# Patient Record
Sex: Female | Born: 1950 | Race: White | Hispanic: No | Marital: Married | Smoking: Former smoker
Health system: Southern US, Community
[De-identification: ages and names within clinical notes are randomized; demographics above are authoritative.]

## PROBLEM LIST (undated history)

## (undated) DIAGNOSIS — Z8619 Personal history of other infectious and parasitic diseases: Secondary | ICD-10-CM

## (undated) DIAGNOSIS — M1990 Unspecified osteoarthritis, unspecified site: Secondary | ICD-10-CM

## (undated) DIAGNOSIS — E785 Hyperlipidemia, unspecified: Secondary | ICD-10-CM

## (undated) DIAGNOSIS — M722 Plantar fascial fibromatosis: Secondary | ICD-10-CM

## (undated) DIAGNOSIS — G8929 Other chronic pain: Secondary | ICD-10-CM

## (undated) DIAGNOSIS — I10 Essential (primary) hypertension: Secondary | ICD-10-CM

## (undated) HISTORY — PX: COLONOSCOPY WITH PROPOFOL: SHX5780

## (undated) HISTORY — DX: Personal history of other infectious and parasitic diseases: Z86.19

## (undated) HISTORY — PX: TUBAL LIGATION: SHX77

## (undated) HISTORY — DX: Hyperlipidemia, unspecified: E78.5

## (undated) HISTORY — DX: Unspecified osteoarthritis, unspecified site: M19.90

## (undated) HISTORY — DX: Essential (primary) hypertension: I10

---

## 2004-05-18 ENCOUNTER — Ambulatory Visit: Payer: Self-pay | Admitting: Internal Medicine

## 2005-06-04 ENCOUNTER — Ambulatory Visit: Payer: Self-pay | Admitting: Internal Medicine

## 2006-06-07 ENCOUNTER — Ambulatory Visit: Payer: Self-pay | Admitting: Internal Medicine

## 2007-09-28 ENCOUNTER — Ambulatory Visit: Payer: Self-pay | Admitting: Internal Medicine

## 2008-03-01 ENCOUNTER — Emergency Department: Payer: Self-pay | Admitting: Emergency Medicine

## 2008-09-30 ENCOUNTER — Ambulatory Visit: Payer: Self-pay | Admitting: Internal Medicine

## 2008-12-27 ENCOUNTER — Ambulatory Visit: Payer: Self-pay | Admitting: Unknown Physician Specialty

## 2009-11-18 ENCOUNTER — Ambulatory Visit: Payer: Self-pay

## 2010-01-05 ENCOUNTER — Ambulatory Visit: Payer: Self-pay | Admitting: Podiatry

## 2010-12-09 ENCOUNTER — Ambulatory Visit: Payer: Self-pay

## 2011-11-26 ENCOUNTER — Ambulatory Visit: Payer: Self-pay

## 2011-12-13 ENCOUNTER — Ambulatory Visit: Payer: Self-pay | Admitting: Internal Medicine

## 2012-02-09 ENCOUNTER — Ambulatory Visit: Payer: Self-pay

## 2013-02-19 ENCOUNTER — Ambulatory Visit: Payer: Self-pay

## 2013-10-04 DIAGNOSIS — E785 Hyperlipidemia, unspecified: Secondary | ICD-10-CM | POA: Insufficient documentation

## 2013-10-04 DIAGNOSIS — I10 Essential (primary) hypertension: Secondary | ICD-10-CM | POA: Insufficient documentation

## 2013-12-05 LAB — HM PAP SMEAR: HM Pap smear: NORMAL

## 2013-12-05 LAB — HM MAMMOGRAPHY: HM Mammogram: NORMAL

## 2014-02-04 LAB — HM COLONOSCOPY

## 2014-02-20 ENCOUNTER — Ambulatory Visit: Payer: Self-pay

## 2014-03-25 ENCOUNTER — Ambulatory Visit: Payer: Self-pay | Admitting: Unknown Physician Specialty

## 2014-03-26 LAB — PATHOLOGY REPORT

## 2014-05-20 ENCOUNTER — Ambulatory Visit: Payer: Self-pay | Admitting: Vascular Surgery

## 2015-02-05 ENCOUNTER — Ambulatory Visit (INDEPENDENT_AMBULATORY_CARE_PROVIDER_SITE_OTHER): Payer: 59 | Admitting: Internal Medicine

## 2015-02-05 ENCOUNTER — Encounter: Payer: Self-pay | Admitting: Internal Medicine

## 2015-02-05 ENCOUNTER — Encounter (INDEPENDENT_AMBULATORY_CARE_PROVIDER_SITE_OTHER): Payer: Self-pay

## 2015-02-05 VITALS — BP 112/62 | HR 70 | Temp 98.2°F | Ht 64.5 in | Wt 160.5 lb

## 2015-02-05 DIAGNOSIS — L900 Lichen sclerosus et atrophicus: Secondary | ICD-10-CM | POA: Diagnosis not present

## 2015-02-05 DIAGNOSIS — E785 Hyperlipidemia, unspecified: Secondary | ICD-10-CM | POA: Diagnosis not present

## 2015-02-05 DIAGNOSIS — I10 Essential (primary) hypertension: Secondary | ICD-10-CM | POA: Diagnosis not present

## 2015-02-05 DIAGNOSIS — R0789 Other chest pain: Secondary | ICD-10-CM

## 2015-02-05 MED ORDER — SIMVASTATIN 20 MG PO TABS: 20.0000 mg | ORAL_TABLET | Freq: Every day | ORAL | Status: DC

## 2015-02-05 MED ORDER — LOSARTAN POTASSIUM-HCTZ 100-25 MG PO TABS: 1.0000 | ORAL_TABLET | Freq: Every day | ORAL | Status: DC

## 2015-02-05 NOTE — Progress Notes (Signed)
Pre visit review using our clinic review tool, if applicable. No additional management support is needed unless otherwise documented below in the visit note. 

## 2015-02-05 NOTE — Progress Notes (Signed)
Subjective:    Patient ID: Mckenzie Willis, female    DOB: 1950/12/26, 64 y.o.   MRN: [id number]  HPI  64YO female presents to establish care.  Previously had some episodes of pressure in her chest. Was evaluated by Dr. Josefa Half in Cardiology and had treadmill stress test and nuclear stress test which were normal. Symptoms have improved.  Following a healthy diet. Previously weighed 176lb.  Wt Readings from Last 3 Encounters:  02/05/15 160 lb 8 oz (40.981 kg)   Lichen sclerosis history in vaginal area. Treated with Clobetasol in the past with improvement.  Past medical, surgical, family and social history per today's encounter.  Review of Systems  Constitutional: Negative for fever, chills, appetite change, fatigue and unexpected weight change.  Eyes: Negative for visual disturbance.  Respiratory: Negative for shortness of breath.   Cardiovascular: Negative for chest pain and leg swelling.  Gastrointestinal: Negative for nausea, vomiting, abdominal pain, diarrhea and constipation.  Genitourinary: Negative for vaginal bleeding, vaginal discharge, genital sores and vaginal pain.  Musculoskeletal: Negative for myalgias and arthralgias.  Skin: Negative for color change and rash.  Hematological: Negative for adenopathy. Does not bruise/bleed easily.  Psychiatric/Behavioral: Negative for suicidal ideas, sleep disturbance and dysphoric mood. The patient is not nervous/anxious.        Objective:    BP 112/62 mmHg  Pulse 70  Temp(Src) 98.2 F (36.8 C) (Oral)  Ht 5' 4.5" (1.638 m)  Wt 160 lb 8 oz (72.802 kg)  BMI 27.13 kg/m2  SpO2 96% Physical Exam  Constitutional: She is oriented to person, place, and time. She appears well-developed and well-nourished. No distress.  HENT:  Head: Normocephalic and atraumatic.  Right Ear: External ear normal.  Left Ear: External ear normal.  Nose: Nose normal.  Mouth/Throat: Oropharynx is clear and moist. No oropharyngeal exudate.    Eyes: Conjunctivae and EOM are normal. Pupils are equal, round, and reactive to light. Right eye exhibits no discharge.  Neck: Normal range of motion. Neck supple. No thyromegaly present.  Cardiovascular: Normal rate, regular rhythm, normal heart sounds and intact distal pulses.  Exam reveals no gallop and no friction rub.   No murmur heard. Pulmonary/Chest: Effort normal. No respiratory distress. She has no wheezes. She has no rales.  Abdominal: Soft. Bowel sounds are normal. She exhibits no distension and no mass. There is no tenderness. There is no rebound and no guarding.  Musculoskeletal: Normal range of motion. She exhibits no edema or tenderness.  Lymphadenopathy:    She has no cervical adenopathy.  Neurological: She is alert and oriented to person, place, and time. No cranial nerve deficit. Coordination normal.  Skin: Skin is warm and dry. No rash noted. She is not diaphoretic. No erythema. No pallor.  Psychiatric: She has a normal mood and affect. Her behavior is normal. Judgment and thought content normal.          Assessment & Plan:   Problem List Items Addressed This Visit      Unprioritized   Benign essential HTN    BP Readings from Last 3 Encounters:  02/05/15 112/62   BP well controlled. Continue Losartan-HCTZ. Will check renal function with labs.      Relevant Medications   simvastatin (ZOCOR) 20 MG tablet   losartan-hydrochlorothiazide (HYZAAR) 100-25 MG per tablet   Chest pressure    Previous workup for CAD reportedly normal including treadmill and nuclear stress test. Symptoms have resolved. Will request results on these tests from [facility].  HLD (hyperlipidemia) - Primary    Will check fasting lipids and LFTs with labs. Continue Simvastatin.      Relevant Medications   simvastatin (ZOCOR) 20 MG tablet   losartan-hydrochlorothiazide (HYZAAR) 100-25 MG per tablet   Lichen sclerosus    Previous h/o lichen sclerosis. Uses prn Clobetasol. Will  continue.          Return in about 4 weeks (around 03/05/2015) for Physical.

## 2015-02-05 NOTE — Assessment & Plan Note (Addendum)
Previous workup for CAD reportedly normal including treadmill and nuclear stress test. Symptoms have resolved. Will request results on these tests from [facility].

## 2015-02-05 NOTE — Assessment & Plan Note (Signed)
Will check fasting lipids and LFTs with labs. Continue Simvastatin.

## 2015-02-05 NOTE — Assessment & Plan Note (Signed)
Previous h/o lichen sclerosis. Uses prn Clobetasol. Will continue.

## 2015-02-05 NOTE — Assessment & Plan Note (Signed)
BP Readings from Last 3 Encounters:  02/05/15 112/62   BP well controlled. Continue Losartan-HCTZ. Will check renal function with labs.

## 2015-02-05 NOTE — Patient Instructions (Addendum)
Fasting labs and physical exam in 1 month.

## 2015-03-17 LAB — CBC AND DIFFERENTIAL
HEMATOCRIT: 39 % (ref 36–46)
Hemoglobin: 13.4 g/dL (ref 12.0–16.0)
NEUTROS ABS: 3 /uL
PLATELETS: 231 10*3/uL (ref 150–399)
WBC: 5.5 10^3/mL

## 2015-03-17 LAB — TSH: TSH: 7.13 u[IU]/mL — AB (ref 0.41–5.90)

## 2015-03-17 LAB — HEPATIC FUNCTION PANEL
ALT: 9 U/L (ref 7–35)
AST: 16 U/L (ref 13–35)
Alkaline Phosphatase: 80 U/L (ref 25–125)
Bilirubin, Total: 0.6 mg/dL

## 2015-03-17 LAB — LIPID PANEL
CHOLESTEROL: 181 mg/dL (ref 0–200)
HDL: 57 mg/dL (ref 35–70)
LDL Cholesterol: 98 mg/dL
TRIGLYCERIDES: 129 mg/dL (ref 40–160)

## 2015-03-17 LAB — BASIC METABOLIC PANEL
BUN: 17 mg/dL (ref 4–21)
Creatinine: 0.7 mg/dL (ref 0.5–1.1)
GLUCOSE: 97 mg/dL
Potassium: 4.2 mmol/L (ref 3.4–5.3)
SODIUM: 140 mmol/L (ref 137–147)

## 2015-03-17 LAB — HEMOGLOBIN A1C: Hgb A1c MFr Bld: 6 % (ref 4.0–6.0)

## 2015-03-20 ENCOUNTER — Telehealth: Payer: Self-pay | Admitting: Internal Medicine

## 2015-03-20 NOTE — Telephone Encounter (Signed)
Labs show elevated blood sugars and low thyroid function. Please set up 74min follow up visit.

## 2015-03-24 ENCOUNTER — Ambulatory Visit (INDEPENDENT_AMBULATORY_CARE_PROVIDER_SITE_OTHER): Payer: 59 | Admitting: Internal Medicine

## 2015-03-24 ENCOUNTER — Encounter: Payer: Self-pay | Admitting: Internal Medicine

## 2015-03-24 VITALS — BP 102/63 | HR 68 | Temp 98.3°F | Ht 64.5 in | Wt 160.1 lb

## 2015-03-24 DIAGNOSIS — E041 Nontoxic single thyroid nodule: Secondary | ICD-10-CM | POA: Diagnosis not present

## 2015-03-24 DIAGNOSIS — Z0000 Encounter for general adult medical examination without abnormal findings: Secondary | ICD-10-CM | POA: Diagnosis not present

## 2015-03-24 DIAGNOSIS — E039 Hypothyroidism, unspecified: Secondary | ICD-10-CM | POA: Diagnosis not present

## 2015-03-24 NOTE — Assessment & Plan Note (Signed)
Recent TSH 7. Will plan to repeat TSH and free T4 in 6-8 weeks.

## 2015-03-24 NOTE — Patient Instructions (Addendum)
Start Vit D 2000units daily Labs to recheck thyroid function in November.  Health Maintenance Adopting a healthy lifestyle and getting preventive care can go a long way to promote health and wellness. Talk with your health care provider about what schedule of regular examinations is right for you. This is a good chance for you to check in with your provider about disease prevention and staying healthy. In between checkups, there are plenty of things you can do on your own. Experts have done a lot of research about which lifestyle changes and preventive measures are most likely to keep you healthy. Ask your health care provider for more information. WEIGHT AND DIET  Eat a healthy diet  Be sure to include plenty of vegetables, fruits, low-fat dairy products, and lean protein.  Do not eat a lot of foods high in solid fats, added sugars, or salt.  Get regular exercise. This is one of the most important things you can do for your health.  Most adults should exercise for at least 150 minutes each week. The exercise should increase your heart rate and make you sweat (moderate-intensity exercise).  Most adults should also do strengthening exercises at least twice a week. This is in addition to the moderate-intensity exercise.  Maintain a healthy weight  Body mass index (BMI) is a measurement that can be used to identify possible weight problems. It estimates body fat based on height and weight. Your health care provider can help determine your BMI and help you achieve or maintain a healthy weight.  For females 39 years of age and older:   A BMI below 18.5 is considered underweight.  A BMI of 18.5 to 24.9 is normal.  A BMI of 25 to 29.9 is considered overweight.  A BMI of 30 and above is considered obese.  Watch levels of cholesterol and blood lipids  You should start having your blood tested for lipids and cholesterol at 62 years of age, then have this test every 5 years.  You may need  to have your cholesterol levels checked more often if:  Your lipid or cholesterol levels are high.  You are older than 62 years of age.  You are at high risk for heart disease.  CANCER SCREENING   Lung Cancer  Lung cancer screening is recommended for adults 84-21 years old who are at high risk for lung cancer because of a history of smoking.  A yearly low-dose CT scan of the lungs is recommended for people who:  Currently smoke.  Have quit within the past 15 years.  Have at least a 30-pack-year history of smoking. A pack year is smoking an average of one pack of cigarettes a day for 1 year.  Yearly screening should continue until it has been 15 years since you quit.  Yearly screening should stop if you develop a health problem that would prevent you from having lung cancer treatment.  Breast Cancer  Practice breast self-awareness. This means understanding how your breasts normally appear and feel.  It also means doing regular breast self-exams. Let your health care provider know about any changes, no matter how small.  If you are in your 20s or 30s, you should have a clinical breast exam (CBE) by a health care provider every 1-3 years as part of a regular health exam.  If you are 26 or older, have a CBE every year. Also consider having a breast X-ray (mammogram) every year.  If you have a family history of breast cancer,  talk to your health care provider about genetic screening.  If you are at high risk for breast cancer, talk to your health care provider about having an MRI and a mammogram every year.  Breast cancer gene (BRCA) assessment is recommended for women who have family members with BRCA-related cancers. BRCA-related cancers include:  Breast.  Ovarian.  Tubal.  Peritoneal cancers.  Results of the assessment will determine the need for genetic counseling and BRCA1 and BRCA2 testing. Cervical Cancer Routine pelvic examinations to screen for cervical cancer  are no longer recommended for nonpregnant women who are considered low risk for cancer of the pelvic organs (ovaries, uterus, and vagina) and who do not have symptoms. A pelvic examination may be necessary if you have symptoms including those associated with pelvic infections. Ask your health care provider if a screening pelvic exam is right for you.   The Pap test is the screening test for cervical cancer for women who are considered at risk.  If you had a hysterectomy for a problem that was not cancer or a condition that could lead to cancer, then you no longer need Pap tests.  If you are older than 65 years, and you have had normal Pap tests for the past 10 years, you no longer need to have Pap tests.  If you have had past treatment for cervical cancer or a condition that could lead to cancer, you need Pap tests and screening for cancer for at least 20 years after your treatment.  If you no longer get a Pap test, assess your risk factors if they change (such as having a new sexual partner). This can affect whether you should start being screened again.  Some women have medical problems that increase their chance of getting cervical cancer. If this is the case for you, your health care provider may recommend more frequent screening and Pap tests.  The human papillomavirus (HPV) test is another test that may be used for cervical cancer screening. The HPV test looks for the virus that can cause cell changes in the cervix. The cells collected during the Pap test can be tested for HPV.  The HPV test can be used to screen women 51 years of age and older. Getting tested for HPV can extend the interval between normal Pap tests from three to five years.  An HPV test also should be used to screen women of any age who have unclear Pap test results.  After 62 years of age, women should have HPV testing as often as Pap tests.  Colorectal Cancer  This type of cancer can be detected and often  prevented.  Routine colorectal cancer screening usually begins at 62 years of age and continues through 62 years of age.  Your health care provider may recommend screening at an earlier age if you have risk factors for colon cancer.  Your health care provider may also recommend using home test kits to check for hidden blood in the stool.  A small camera at the end of a tube can be used to examine your colon directly (sigmoidoscopy or colonoscopy). This is done to check for the earliest forms of colorectal cancer.  Routine screening usually begins at age 34.  Direct examination of the colon should be repeated every 5-10 years through 62 years of age. However, you may need to be screened more often if early forms of precancerous polyps or small growths are found. Skin Cancer  Check your skin from head to toe regularly.  Tell your health care provider about any new moles or changes in moles, especially if there is a change in a mole's shape or color.  Also tell your health care provider if you have a mole that is larger than the size of a pencil eraser.  Always use sunscreen. Apply sunscreen liberally and repeatedly throughout the day.  Protect yourself by wearing long sleeves, pants, a wide-brimmed hat, and sunglasses whenever you are outside. HEART DISEASE, DIABETES, AND HIGH BLOOD PRESSURE   Have your blood pressure checked at least every 1-2 years. High blood pressure causes heart disease and increases the risk of stroke.  If you are between 72 years and 44 years old, ask your health care provider if you should take aspirin to prevent strokes.  Have regular diabetes screenings. This involves taking a blood sample to check your fasting blood sugar level.  If you are at a normal weight and have a low risk for diabetes, have this test once every three years after 62 years of age.  If you are overweight and have a high risk for diabetes, consider being tested at a younger age or more  often. PREVENTING INFECTION  Hepatitis B  If you have a higher risk for hepatitis B, you should be screened for this virus. You are considered at high risk for hepatitis B if:  You were born in a country where hepatitis B is common. Ask your health care provider which countries are considered high risk.  Your parents were born in a high-risk country, and you have not been immunized against hepatitis B (hepatitis B vaccine).  You have HIV or AIDS.  You use needles to inject street drugs.  You live with someone who has hepatitis B.  You have had sex with someone who has hepatitis B.  You get hemodialysis treatment.  You take certain medicines for conditions, including cancer, organ transplantation, and autoimmune conditions. Hepatitis C  Blood testing is recommended for:  Everyone born from [birth year] through 1963.  Anyone with known risk factors for hepatitis C. Sexually transmitted infections (STIs)  You should be screened for sexually transmitted infections (STIs) including gonorrhea and chlamydia if:  You are sexually active and are younger than 62 years of age.  You are older than 62 years of age and your health care provider tells you that you are at risk for this type of infection.  Your sexual activity has changed since you were last screened and you are at an increased risk for chlamydia or gonorrhea. Ask your health care provider if you are at risk.  If you do not have HIV, but are at risk, it may be recommended that you take a prescription medicine daily to prevent HIV infection. This is called pre-exposure prophylaxis (PrEP). You are considered at risk if:  You are sexually active and do not regularly use condoms or know the HIV status of your partner(s).  You take drugs by injection.  You are sexually active with a partner who has HIV. Talk with your health care provider about whether you are at high risk of being infected with HIV. If you choose to begin PrEP, you  should first be tested for HIV. You should then be tested every 3 months for as long as you are taking PrEP.  PREGNANCY   If you are premenopausal and you may become pregnant, ask your health care provider about preconception counseling.  If you may become pregnant, take 400 to 800 micrograms (mcg) of folic acid  every day.  If you want to prevent pregnancy, talk to your health care provider about birth control (contraception). OSTEOPOROSIS AND MENOPAUSE   Osteoporosis is a disease in which the bones lose minerals and strength with aging. This can result in serious bone fractures. Your risk for osteoporosis can be identified using a bone density scan.  If you are 63 years of age or older, or if you are at risk for osteoporosis and fractures, ask your health care provider if you should be screened.  Ask your health care provider whether you should take a calcium or vitamin D supplement to lower your risk for osteoporosis.  Menopause may have certain physical symptoms and risks.  Hormone replacement therapy may reduce some of these symptoms and risks. Talk to your health care provider about whether hormone replacement therapy is right for you.  HOME CARE INSTRUCTIONS   Schedule regular health, dental, and eye exams.  Stay current with your immunizations.   Do not use any tobacco products including cigarettes, chewing tobacco, or electronic cigarettes.  If you are pregnant, do not drink alcohol.  If you are breastfeeding, limit how much and how often you drink alcohol.  Limit alcohol intake to no more than 1 drink per day for nonpregnant women. One drink equals 12 ounces of beer, 5 ounces of wine, or 1 ounces of hard liquor.  Do not use street drugs.  Do not share needles.  Ask your health care provider for help if you need support or information about quitting drugs.  Tell your health care provider if you often feel depressed.  Tell your health care provider if you have ever  been abused or do not feel safe at home. Document Released: 01/25/2011 Document Revised: 11/26/2013 Document Reviewed: 06/13/2013 [facility] Patient Information 2015 [place], [place]. This information is not intended to replace advice given to you by your health care provider. Make sure you discuss any questions you have with your health care provider.

## 2015-03-24 NOTE — Assessment & Plan Note (Signed)
General medical exam including breast exam normal today. PAP and pelvic deferred as reportedly normal 2014/2015, will request report. Mammogram ordered. Colonoscopy UTD. Labs reviewed. Flu vaccine to be obtained through her work.

## 2015-03-24 NOTE — Progress Notes (Signed)
Subjective:    Patient ID: Mckenzie Willis, female    DOB: Jun 13, 1951, 64 y.o.   MRN: [id number]  HPI  64YO female presents for annual exam.  Feeling well. No concerns today.  We reviewed recent labs which showed elevated TSH. She denies temp intolerance, constipation, fatigue, dry skin.  Past Medical History  Diagnosis Date  . Arthritis   . History of chicken pox   . Hyperlipidemia   . Hypertension    Family History  Problem Relation Age of Onset  . Hypertension Mother   . Heart disease Mother     s/p CABG in 42s  . Hypertension Brother   . Cancer Brother     Melanoma   . Hypertension Maternal Grandmother   . Diabetes Paternal Grandmother   . Cancer Paternal Aunt     Breast    Past Surgical History  Procedure Laterality Date  . Tubal ligation     Social History   Social History  . Marital Status: Married    Spouse Name: N/A  . Number of Children: N/A  . Years of Education: N/A   Social History Main Topics  . Smoking status: Former Research scientist (life sciences)  . Smokeless tobacco: None  . Alcohol Use: 0.0 oz/week    0 Standard drinks or equivalent per week     Comment: Occ glass of wine   . Drug Use: No  . Sexual Activity: Not Asked   Other Topics Concern  . None   Social History Narrative   Lives in [place] with husband and mother. Has 2 dogs in home.      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  HENT: Negative for trouble swallowing.   Eyes: Negative for visual disturbance.  Respiratory: Negative for shortness of breath.   Cardiovascular: Negative for chest pain and leg swelling.  Gastrointestinal: Negative for nausea, vomiting, abdominal pain, diarrhea and constipation.  Endocrine: Negative for cold intolerance and heat intolerance.  Musculoskeletal: Negative  for myalgias, arthralgias and neck pain.  Skin: Negative for color change and rash.  Hematological: Negative for adenopathy. Does not bruise/bleed easily.  Psychiatric/Behavioral: Negative for suicidal ideas, sleep disturbance and dysphoric mood. The patient is not nervous/anxious.        Objective:    BP 102/63 mmHg  Pulse 68  Temp(Src) 98.3 F (36.8 C) (Oral)  Ht 5' 4.5" (1.638 m)  Wt 160 lb 2 oz (72.632 kg)  BMI 27.07 kg/m2  SpO2 97% Physical Exam  Constitutional: She is oriented to person, place, and time. She appears well-developed and well-nourished. No distress.  HENT:  Head: Normocephalic and atraumatic.    Right Ear: External ear normal.  Left Ear: External ear normal.  Nose: Nose normal.  Mouth/Throat: Oropharynx is clear and moist. No oropharyngeal exudate.  Eyes: Conjunctivae are normal. Pupils are equal, round, and reactive to light. Right eye exhibits no discharge. Left eye exhibits no discharge. No scleral icterus.  Neck: Normal range of motion. Neck supple. No tracheal tenderness present. Carotid bruit is not present. No rigidity. No tracheal deviation, no edema, no erythema and normal range of motion present. Thyroid mass present. No thyromegaly present.    Cardiovascular: Normal rate, regular rhythm, normal heart sounds and intact distal pulses.  Exam reveals  no gallop and no friction rub.   No murmur heard. Pulmonary/Chest: Effort normal and breath sounds normal. No accessory muscle usage or stridor. No tachypnea. No respiratory distress. She has no decreased breath sounds. She has no wheezes. She has no rales. She exhibits no tenderness. Right breast exhibits no inverted nipple, no mass, no nipple discharge, no skin change and no tenderness. Left breast exhibits no inverted nipple, no mass, no nipple discharge, no skin change and no tenderness. Breasts are symmetrical.  Abdominal: Soft. Bowel sounds are normal. She exhibits no distension and no mass. There is no  tenderness. There is no rebound and no guarding.  Musculoskeletal: Normal range of motion. She exhibits no edema or tenderness.  Lymphadenopathy:    She has no cervical adenopathy.  Neurological: She is alert and oriented to person, place, and time. No cranial nerve deficit. She exhibits normal muscle tone. Coordination normal.  Skin: Skin is warm and dry. No rash noted. She is not diaphoretic. No erythema. No pallor.  Psychiatric: She has a normal mood and affect. Her behavior is normal. Judgment and thought content normal.          Assessment & Plan:   Problem List Items Addressed This Visit      Unprioritized   Hypothyroidism    Recent TSH 7. Will plan to repeat TSH and free T4 in 6-8 weeks.       Routine general medical examination at a health care facility - Primary    General medical exam including breast exam normal today. PAP and pelvic deferred as reportedly normal 2014/2015, will request report. Mammogram ordered. Colonoscopy UTD. Labs reviewed. Flu vaccine to be obtained through her work.      Relevant Orders   MM Digital Screening   Thyroid nodule    Thyroid nodule noted on exam. Will request report on previous US from [facility].          Return in about 3 months (around 06/24/2015) for Recheck.

## 2015-03-24 NOTE — Assessment & Plan Note (Signed)
Thyroid nodule noted on exam. Will request report on previous US from [facility].

## 2015-03-24 NOTE — Progress Notes (Signed)
Pre visit review using our clinic review tool, if applicable. No additional management support is needed unless otherwise documented below in the visit note. 

## 2015-03-27 ENCOUNTER — Encounter: Payer: Self-pay | Admitting: Internal Medicine

## 2015-04-02 ENCOUNTER — Ambulatory Visit
Admission: RE | Admit: 2015-04-02 | Discharge: 2015-04-02 | Disposition: A | Discharge status: Home / Self Care | Payer: 59 | Source: Ambulatory Visit | Attending: Internal Medicine | Admitting: Internal Medicine

## 2015-04-02 DIAGNOSIS — Z0000 Encounter for general adult medical examination without abnormal findings: Secondary | ICD-10-CM | POA: Diagnosis present

## 2015-04-02 DIAGNOSIS — Z1231 Encounter for screening mammogram for malignant neoplasm of breast: Secondary | ICD-10-CM | POA: Diagnosis present

## 2015-04-03 ENCOUNTER — Other Ambulatory Visit: Payer: Self-pay | Admitting: Internal Medicine

## 2015-04-03 DIAGNOSIS — N6489 Other specified disorders of breast: Secondary | ICD-10-CM

## 2015-04-08 ENCOUNTER — Ambulatory Visit
Admission: RE | Admit: 2015-04-08 | Discharge: 2015-04-08 | Disposition: A | Discharge status: Home / Self Care | Payer: 59 | Source: Ambulatory Visit | Attending: Internal Medicine | Admitting: Internal Medicine

## 2015-04-08 DIAGNOSIS — N6489 Other specified disorders of breast: Secondary | ICD-10-CM

## 2015-04-11 ENCOUNTER — Ambulatory Visit: Payer: 59

## 2015-04-11 ENCOUNTER — Other Ambulatory Visit: Payer: 59

## 2015-06-17 LAB — TSH: TSH: 5.29 u[IU]/mL (ref 0.41–5.90)

## 2015-06-24 ENCOUNTER — Ambulatory Visit: Payer: 59 | Admitting: Internal Medicine

## 2015-06-26 ENCOUNTER — Encounter: Payer: Self-pay | Admitting: Internal Medicine

## 2015-06-26 ENCOUNTER — Ambulatory Visit (INDEPENDENT_AMBULATORY_CARE_PROVIDER_SITE_OTHER): Payer: 59 | Admitting: Internal Medicine

## 2015-06-26 VITALS — BP 130/64 | HR 73 | Temp 98.2°F | Ht 65.0 in | Wt 168.5 lb

## 2015-06-26 DIAGNOSIS — E039 Hypothyroidism, unspecified: Secondary | ICD-10-CM

## 2015-06-26 DIAGNOSIS — I10 Essential (primary) hypertension: Secondary | ICD-10-CM

## 2015-06-26 NOTE — Patient Instructions (Signed)
Please have fasting labs done in 3 months.

## 2015-06-26 NOTE — Progress Notes (Signed)
Subjective:    Patient ID: Mckenzie Willis, female    DOB: 07-Jul-1951, 64 y.o.   MRN: [id number]  HPI 64YO female presents for follow up.  Thyroid function was low with TSH of 7 in 02/2015.  Feeling well. Had repeat labs performed. TSH 5. Free T4 normal. Notes some fatigue but attributes this to work schedule and caring for grandson. Otherwise, feeling well.  Wt Readings from Last 3 Encounters:  06/26/15 168 lb 8 oz (76.431 kg)  03/24/15 160 lb 2 oz (72.632 kg)  02/05/15 160 lb 8 oz (72.802 kg)   BP Readings from Last 3 Encounters:  06/26/15 130/64  03/24/15 102/63  02/05/15 112/62    Past Medical History  Diagnosis Date  . Arthritis   . History of chicken pox   . Hyperlipidemia   . Hypertension    Family History  Problem Relation Age of Onset  . Hypertension Mother   . Heart disease Mother     s/p CABG in 69s  . Hypertension Brother   . Cancer Brother     Melanoma   . Hypertension Maternal Grandmother   . Diabetes Paternal Grandmother   . Cancer Paternal Aunt     Breast   . Breast cancer Paternal Aunt   . Breast cancer Maternal Aunt     great aunt   Past Surgical History  Procedure Laterality Date  . Tubal ligation     Social History   Social History  . Marital Status: Married    Spouse Name: N/A  . Number of Children: N/A  . Years of Education: N/A   Social History Main Topics  . Smoking status: Former Research scientist (life sciences)  . Smokeless tobacco: None  . Alcohol Use: 0.0 oz/week    0 Standard drinks or equivalent per week     Comment: Occ glass of wine   . Drug Use: No  . Sexual Activity: Not Asked   Other Topics Concern  . None   Social History Narrative   Lives in [place] with husband and mother. Has 2 dogs in home.      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 Negative for fever, chills, appetite change and  unexpected weight change.  HENT: Negative for trouble swallowing.   Eyes: Negative for visual disturbance.  Respiratory: Negative for shortness of breath.   Cardiovascular: Negative for chest pain and leg swelling.  Gastrointestinal: Negative for abdominal pain, diarrhea and constipation.  Skin: Negative for color change and rash.  Hematological: Negative for adenopathy. Does not bruise/bleed easily.  Psychiatric/Behavioral: Negative for sleep disturbance and dysphoric mood. The patient is not nervous/anxious.        Objective:    BP 130/64 mmHg  Pulse 73  Temp(Src) 98.2 F (36.8 C) (Oral)  Ht 5\' 5"  (1.651 m)  Wt 168 lb 8 oz (76.431 kg)  BMI 28.04 kg/m2  SpO2 98% Physical Exam  Constitutional: She is oriented to person, place, and time. She appears well-developed and well-nourished. No distress.  HENT:  Head: Normocephalic and atraumatic.  Right Ear: External ear normal.  Left Ear: External ear normal.  Nose: Nose normal.  Mouth/Throat: Oropharynx is clear and moist. No oropharyngeal exudate.  Eyes: Conjunctivae are normal. Pupils are equal, round, and reactive to light. Right eye exhibits no discharge. Left eye  exhibits no discharge. No scleral icterus.  Neck: Normal range of motion. Neck supple. No tracheal deviation present. No thyromegaly present.  Cardiovascular: Normal rate, regular rhythm, normal heart sounds and intact distal pulses.  Exam reveals no gallop and no friction rub.   No murmur heard. Pulmonary/Chest: Effort normal and breath sounds normal. No respiratory distress. She has no wheezes. She has no rales. She exhibits no tenderness.  Musculoskeletal: Normal range of motion. She exhibits no edema or tenderness.  Lymphadenopathy:    She has no cervical adenopathy.  Neurological: She is alert and oriented to person, place, and time. No cranial nerve deficit. She exhibits normal muscle tone. Coordination normal.  Skin: Skin is warm and dry. No rash noted. She is  not diaphoretic. No erythema. No pallor.  Psychiatric: She has a normal mood and affect. Her behavior is normal. Judgment and thought content normal.          Assessment & Plan:   Problem List Items Addressed This Visit      Unprioritized   Benign essential HTN    BP Readings from Last 3 Encounters:  06/26/15 130/64  03/24/15 102/63  02/05/15 112/62   BP well controlled. Repeat renal function in 3 months.      Hypothyroidism - Primary    Repeat TSH was improved. Free t4 normal. Will plan to repeat labs again in 3-6 months.          Return in about 3 months (around 09/24/2015) for Recheck.

## 2015-06-26 NOTE — Assessment & Plan Note (Signed)
BP Readings from Last 3 Encounters:  06/26/15 130/64  03/24/15 102/63  02/05/15 112/62   BP well controlled. Repeat renal function in 3 months.

## 2015-06-26 NOTE — Assessment & Plan Note (Signed)
Repeat TSH was improved. Free t4 normal. Will plan to repeat labs again in 3-6 months.

## 2015-06-26 NOTE — Progress Notes (Signed)
Pre visit review using our clinic review tool, if applicable. No additional management support is needed unless otherwise documented below in the visit note. 

## 2015-12-15 ENCOUNTER — Other Ambulatory Visit: Payer: Self-pay | Admitting: Internal Medicine

## 2015-12-16 ENCOUNTER — Telehealth: Payer: Self-pay

## 2015-12-16 LAB — COMPREHENSIVE METABOLIC PANEL
A/G RATIO: 1.7 (ref 1.2–2.2)
ALBUMIN: 4.6 g/dL (ref 3.6–4.8)
ALT: 11 IU/L (ref 0–32)
AST: 18 IU/L (ref 0–40)
Alkaline Phosphatase: 74 IU/L (ref 39–117)
BILIRUBIN TOTAL: 0.5 mg/dL (ref 0.0–1.2)
BUN/Creatinine Ratio: 19 (ref 12–28)
BUN: 12 mg/dL (ref 8–27)
CO2: 25 mmol/L (ref 18–29)
Calcium: 9.3 mg/dL (ref 8.7–10.3)
Chloride: 98 mmol/L (ref 96–106)
Creatinine, Ser: 0.62 mg/dL (ref 0.57–1.00)
GFR calc Af Amer: 111 mL/min/{1.73_m2} (ref >59)
GFR calc non Af Amer: 96 mL/min/{1.73_m2} (ref >59)
Globulin, Total: 2.7 g/dL (ref 1.5–4.5)
Glucose: 90 mg/dL (ref 65–99)
POTASSIUM: 3.9 mmol/L (ref 3.5–5.2)
SODIUM: 143 mmol/L (ref 134–144)
TOTAL PROTEIN: 7.3 g/dL (ref 6.0–8.5)

## 2015-12-16 LAB — LIPID PANEL W/O CHOL/HDL RATIO
Cholesterol, Total: 230 mg/dL — ABNORMAL HIGH (ref 100–199)
HDL: 65 mg/dL (ref >39)
LDL Calculated: 143 mg/dL — ABNORMAL HIGH (ref 0–99)
TRIGLYCERIDES: 111 mg/dL (ref 0–149)
VLDL Cholesterol Cal: 22 mg/dL (ref 5–40)

## 2015-12-16 LAB — TSH: TSH: 4.16 u[IU]/mL (ref 0.450–4.500)

## 2015-12-16 LAB — T4, FREE: Free T4: 1.4 ng/dL (ref 0.82–1.77)

## 2015-12-16 NOTE — Telephone Encounter (Signed)
-----   Message from Jackolyn Confer, MD sent at 12/16/2015 10:19 AM EDT ----- Labs are remarkable for mild elevation of cholesterol.  We can review in detail at next visit. Thyroid levels are normal. Kidney and liver function are normal.

## 2015-12-16 NOTE — Telephone Encounter (Signed)
Left message for patient to call office regarding lab results and recommendations. 

## 2015-12-17 ENCOUNTER — Telehealth: Payer: Self-pay

## 2015-12-17 MED ORDER — SIMVASTATIN 20 MG PO TABS: 20.0000 mg | ORAL_TABLET | Freq: Every day | ORAL | Status: DC

## 2015-12-17 NOTE — Telephone Encounter (Signed)
Fine to restart Simvastatin 20mg  daily #90 with 3 refills.

## 2015-12-17 NOTE — Telephone Encounter (Signed)
Patient informed about lab results.  She would like to go back on simvastatin.  Please advise.

## 2015-12-17 NOTE — Telephone Encounter (Signed)
Simvastatin refilled, patient aware to start.

## 2015-12-17 NOTE — Telephone Encounter (Signed)
-----   Message from Jackolyn Confer, MD sent at 12/16/2015 10:19 AM EDT ----- Labs are remarkable for mild elevation of cholesterol.  We can review in detail at next visit. Thyroid levels are normal. Kidney and liver function are normal.

## 2016-02-03 ENCOUNTER — Other Ambulatory Visit: Payer: Self-pay | Admitting: Internal Medicine

## 2016-03-22 ENCOUNTER — Other Ambulatory Visit: Payer: Self-pay

## 2016-03-22 MED ORDER — LOSARTAN POTASSIUM-HCTZ 100-25 MG PO TABS: 1.0000 | ORAL_TABLET | Freq: Every day | ORAL | 1 refills | Status: DC

## 2016-07-14 ENCOUNTER — Other Ambulatory Visit: Payer: Self-pay | Admitting: Obstetrics and Gynecology

## 2016-07-14 DIAGNOSIS — Z1231 Encounter for screening mammogram for malignant neoplasm of breast: Secondary | ICD-10-CM

## 2016-07-26 HISTORY — PX: TOE SURGERY: SHX1073

## 2016-08-06 ENCOUNTER — Ambulatory Visit
Admission: RE | Admit: 2016-08-06 | Discharge: 2016-08-06 | Disposition: A | Discharge status: Home / Self Care | Payer: 59 | Source: Ambulatory Visit | Attending: Obstetrics and Gynecology | Admitting: Obstetrics and Gynecology

## 2016-08-06 ENCOUNTER — Other Ambulatory Visit: Payer: Self-pay | Admitting: Obstetrics and Gynecology

## 2016-08-06 DIAGNOSIS — Z1231 Encounter for screening mammogram for malignant neoplasm of breast: Secondary | ICD-10-CM

## 2016-08-17 ENCOUNTER — Telehealth: Payer: Self-pay | Admitting: Family

## 2016-08-17 NOTE — Telephone Encounter (Signed)
Pt switched to Mckenzie Willis, pt will call back to sch appt at a later time. Thank you!

## 2016-09-03 ENCOUNTER — Other Ambulatory Visit: Payer: Self-pay | Admitting: Family Medicine

## 2016-09-06 NOTE — Telephone Encounter (Signed)
Refilled 2 months; please advise that she needs appt within month. She needs to est care and have blood work

## 2016-09-06 NOTE — Telephone Encounter (Signed)
Refilled 03/22/16. Pt last seen 06/26/15. Pt called to switch to [facility] on 08/17/16 with no appt made. Please advise/

## 2016-10-04 ENCOUNTER — Other Ambulatory Visit: Payer: Self-pay | Admitting: Family

## 2017-10-03 ENCOUNTER — Other Ambulatory Visit: Payer: Self-pay | Admitting: Nurse Practitioner

## 2017-10-03 DIAGNOSIS — Z1231 Encounter for screening mammogram for malignant neoplasm of breast: Secondary | ICD-10-CM

## 2017-10-12 ENCOUNTER — Ambulatory Visit
Admission: RE | Admit: 2017-10-12 | Discharge: 2017-10-12 | Disposition: A | Discharge status: Home / Self Care | Payer: Managed Care, Other (non HMO) | Source: Ambulatory Visit | Attending: Nurse Practitioner | Admitting: Nurse Practitioner

## 2017-10-12 DIAGNOSIS — Z1231 Encounter for screening mammogram for malignant neoplasm of breast: Secondary | ICD-10-CM | POA: Diagnosis present

## 2018-02-13 ENCOUNTER — Ambulatory Visit (INDEPENDENT_AMBULATORY_CARE_PROVIDER_SITE_OTHER): Payer: Managed Care, Other (non HMO)

## 2018-02-13 ENCOUNTER — Ambulatory Visit: Payer: Managed Care, Other (non HMO) | Admitting: Podiatry

## 2018-02-13 ENCOUNTER — Encounter: Payer: Self-pay | Admitting: Podiatry

## 2018-02-13 VITALS — BP 143/72 | HR 70 | Resp 16

## 2018-02-13 DIAGNOSIS — M898X7 Other specified disorders of bone, ankle and foot: Secondary | ICD-10-CM

## 2018-02-13 NOTE — Progress Notes (Signed)
  Subjective:  Patient ID: Mckenzie Willis, female    DOB: Jan 26, 1951,  MRN: [id number] HPI Chief Complaint  Patient presents with  . 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 New Patient (Initial Visit)    67 y.o. female presents with the above complaint.   ROS: Denies fever chills nausea vomiting muscle aches pains calf pain back pain chest pain shortness of breath.  Past Medical History:  Diagnosis Date  . Arthritis   . History of chicken pox   . Hyperlipidemia   . Hypertension    Past Surgical History:  Procedure Laterality Date  . TUBAL LIGATION      Current Outpatient Medications:  .  losartan-hydrochlorothiazide (HYZAAR) 100-25 MG tablet, TAKE 1 TABLET BY MOUTH  DAILY, Disp: 60 tablet, Rfl: 3  No Known Allergies Review of Systems Objective:   Vitals:   02/13/18 1502  BP: (!) 143/72  Pulse: 70  Resp: 16    General: Well developed, nourished, in no acute distress, alert and oriented x3   Dermatological: Skin is warm, dry and supple bilateral. Nails x 10 are well maintained; remaining integument appears unremarkable at this time. There are no open sores, no preulcerative lesions, no rash or signs of infection present.  Vascular: Dorsalis Pedis artery and Posterior Tibial artery pedal pulses are 2/4 bilateral with immedate capillary fill time. Pedal hair growth present. No varicosities and no lower extremity edema present bilateral.   Neruologic: Grossly intact via light touch bilateral. Vibratory intact via tuning fork bilateral. Protective threshold with Semmes Wienstein monofilament intact to all pedal sites bilateral. Patellar and Achilles deep tendon reflexes 2+ bilateral. No Babinski or clonus noted bilateral.   Musculoskeletal: No gross boney pedal deformities bilateral. No pain, crepitus, or  limitation noted with foot and ankle range of motion bilateral. Muscular strength 5/5 in all groups tested bilateral.  Rotated hammertoe deformity with pain on the lateral aspect of the nailbed and plate.  Gait: Unassisted, Nonantalgic.    Radiographs:  Radiographs taken today do not demonstrate any major abnormality other than some osteoarthritic changes to the fifth digit left foot.  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  Consented her today for a rotational arthroplasty fifth digit left as well as an exostectomy fifth digit left with excision of lateral border of the nail.     Azjah Pardo T. [place], [place]

## 2018-02-13 NOTE — Patient Instructions (Signed)
Pre-Operative Instructions  Congratulations, you have decided to take an important step towards improving your quality of life.  You can be assured that the doctors and staff at [facility] will be with you every step of the way.  Here are some important things you should know:  1. Plan to be at the surgery center/hospital at least 1 (one) hour prior to your scheduled time, unless otherwise directed by the surgical center/hospital staff.  You must have a responsible adult accompany you, remain during the surgery and drive you home.  Make sure you have directions to the surgical center/hospital to ensure you arrive on time. 2. If you are having surgery at [facility] or [facility] hospitals, you will need a copy of your medical history and physical form from your family physician within one month prior to the date of surgery. We will give you a form for your primary physician to complete.  3. We make every effort to accommodate the date you request for surgery.  However, there are times where surgery dates or times have to be moved.  We will contact you as soon as possible if a change in schedule is required.   4. No aspirin/ibuprofen for one week before surgery.  If you are on aspirin, any non-steroidal anti-inflammatory medications (Mobic, Aleve, Ibuprofen) should not be taken seven (7) days prior to your surgery.  You make take Tylenol for pain prior to surgery.  5. Medications - If you are taking daily heart and blood pressure medications, seizure, reflux, allergy, asthma, anxiety, pain or diabetes medications, make sure you notify the surgery center/hospital before the day of surgery so they can tell you which medications you should take or avoid the day of surgery. 6. No food or drink after midnight the night before surgery unless directed otherwise by surgical center/hospital staff. 7. No alcoholic beverages 24-hours prior to surgery.  No smoking 24-hours prior or 24-hours after  surgery. 8. Wear loose pants or shorts. They should be loose enough to fit over bandages, boots, and casts. 9. Don't wear slip-on shoes. Sneakers are preferred. 10. Bring your boot with you to the surgery center/hospital.  Also bring crutches or a walker if your physician has prescribed it for you.  If you do not have this equipment, it will be provided for you after surgery. 11. If you have not been contacted by the surgery center/hospital by the day before your surgery, call to confirm the date and time of your surgery. 12. Leave-time from work may vary depending on the type of surgery you have.  Appropriate arrangements should be made prior to surgery with your employer. 13. Prescriptions will be provided immediately following surgery by your doctor.  Fill these as soon as possible after surgery and take the medication as directed. Pain medications will not be refilled on weekends and must be approved by the doctor. 14. Remove nail polish on the operative foot and avoid getting pedicures prior to surgery. 15. Wash the night before surgery.  The night before surgery wash the foot and leg well with water and the antibacterial soap provided. Be sure to pay special attention to beneath the toenails and in between the toes.  Wash for at least three (3) minutes. Rinse thoroughly with water and dry well with a towel.  Perform this wash unless told not to do so by your physician.  Enclosed: 1 Ice pack (please put in freezer the night before surgery)   1 Hibiclens skin cleaner     Pre-op instructions  If you have any questions regarding the instructions, please do not hesitate to call our office.  [place]: [address], [place], [place] [address] -- [phone]  [place]: [address] [place], [place] [address] -- [phone]  [place]: [address]  , [place] [address] -- [phone]  [place]: [address], [address], [place], [place] [address] -- [phone]  Website:  [email or web address] 

## 2018-02-16 ENCOUNTER — Telehealth: Payer: Self-pay | Admitting: *Deleted

## 2018-02-16 NOTE — Telephone Encounter (Signed)
"  I'm calling to schedule my appointment for surgery.  I'm a patient of Dr. Milinda Pointer.  He told me to give you a call."  Do you have a date in mind that you would like to schedule it?  "Does he have anything on August 30?"  He does not have anything available on that date.  "Does he have anything before that date?"  He can do it on August 23 but August 16 would be better.  "Let's schedule it for August 16 then.  What time will I need to be there?"  Someone from the surgical center will call you a day or two prior to your surgery date.  They will give you your arrival time.  "Can I register now?"  Yes, you can register now since you have your surgery date.  "I forgot to ask you something.  Do I need to call and get my surgery authorized or is that something that you all take care of?"  I will call to get the authorization if it's needed.

## 2018-03-08 ENCOUNTER — Other Ambulatory Visit: Payer: Self-pay | Admitting: Podiatry

## 2018-03-08 MED ORDER — OXYCODONE-ACETAMINOPHEN 10-325 MG PO TABS: 1.0000 | ORAL_TABLET | Freq: Four times a day (QID) | ORAL | 0 refills | Status: AC | PRN

## 2018-03-08 MED ORDER — ONDANSETRON HCL 4 MG PO TABS: 4.0000 mg | ORAL_TABLET | Freq: Three times a day (TID) | ORAL | 0 refills | Status: DC | PRN

## 2018-03-08 MED ORDER — CEPHALEXIN 500 MG PO CAPS: 500.0000 mg | ORAL_CAPSULE | Freq: Three times a day (TID) | ORAL | 0 refills | Status: DC

## 2018-03-10 ENCOUNTER — Encounter: Payer: Self-pay | Admitting: Podiatry

## 2018-03-10 DIAGNOSIS — M25775 Osteophyte, left foot: Secondary | ICD-10-CM

## 2018-03-10 DIAGNOSIS — M2042 Other hammer toe(s) (acquired), left foot: Secondary | ICD-10-CM | POA: Diagnosis not present

## 2018-03-13 ENCOUNTER — Telehealth: Payer: Self-pay | Admitting: *Deleted

## 2018-03-13 NOTE — Telephone Encounter (Signed)
POST OP CALL-    1) General condition stated by the patient: GOOD  2) Is the pt having pain? SOME  3) Pain score:   4) Has the pt taken Rx'd pain medication, regularly or PRN? PRN  5) Is the pain medication giving relief? YES  6) Any fever, chills, nausea, or vomiting, shortness of breath or tightness in calf? PAIN MEDS MADE NAUSEATED  7) Is the bandage clean, dry and intact? YES  8) Is there excessive tightness, bleeding or drainage coming through the bandage? NO  9) Did you understand all of the post op instruction sheet given? YES  10) Any questions or concerns regarding post op care/recovery? QUESTIONS REGARDING WHEN STITCHES WOULD BE REMOVED TOLD HER THEY WOULD BE EVALUATED AT 2 WEEKS AND REMOVED IF INCISION LOOKED GOOD    Confirmed POV appointment with patient

## 2018-03-15 ENCOUNTER — Ambulatory Visit (INDEPENDENT_AMBULATORY_CARE_PROVIDER_SITE_OTHER): Payer: Managed Care, Other (non HMO)

## 2018-03-15 ENCOUNTER — Ambulatory Visit (INDEPENDENT_AMBULATORY_CARE_PROVIDER_SITE_OTHER): Payer: Managed Care, Other (non HMO) | Admitting: Podiatry

## 2018-03-15 ENCOUNTER — Encounter: Payer: Self-pay | Admitting: Podiatry

## 2018-03-15 VITALS — BP 137/79 | HR 69 | Temp 97.8°F

## 2018-03-15 DIAGNOSIS — M898X7 Other specified disorders of bone, ankle and foot: Secondary | ICD-10-CM

## 2018-03-15 NOTE — Progress Notes (Signed)
She presents today for her first postop visit date of surgery March 10, 2018 the rotational hammertoe with exostectomy lateral aspect of the fifth digit.  States that everything is doing well and she is not having much pain she states that she did not even have to take a pain pill.  She denies fever chills nausea vomiting muscle aches and pains.  Objective: Presents with her Darco shoe today dressed sterile dressing intact was removed demonstrates no erythema edema cellulitis drainage or odor toe is rectus in good position sutures are intact margins are well coapted there is no signs of infection.  Radiographs confirm arthroplasty and exostectomy fifth digit left.  Assessment: Well-healing surgical foot.  Plan: Redressed today dressed a compressive dressing follow-up with her in 1 week for suture removal.

## 2018-03-22 ENCOUNTER — Ambulatory Visit (INDEPENDENT_AMBULATORY_CARE_PROVIDER_SITE_OTHER): Payer: Managed Care, Other (non HMO) | Admitting: Podiatry

## 2018-03-22 ENCOUNTER — Encounter: Payer: Self-pay | Admitting: Podiatry

## 2018-03-22 DIAGNOSIS — M898X7 Other specified disorders of bone, ankle and foot: Secondary | ICD-10-CM

## 2018-03-22 NOTE — Progress Notes (Signed)
She presents today for follow-up of her derotational hammertoe repair with exostectomy fifth left.  Date of surgery 03/10/2018.  She states that seems to be getting better all the time.  Objective: Vital signs are stable she is alert and oriented x3.  Pulses are palpable.  Dry sterile dressing intact was removed demonstrates no erythema to some mild edema no cellulitis drainage or odor sutures appear to be intact margins for the most part appear to be well coapted I was able to remove some of the sutures today but decided to relieve some of the sutures and to maintain correction.  Assessment: Well-healing surgical foot.  Plan: We will follow-up with her in 1 week just to remove the remaining sutures and I placed her in a light Coban dressing.

## 2018-03-29 ENCOUNTER — Ambulatory Visit (INDEPENDENT_AMBULATORY_CARE_PROVIDER_SITE_OTHER): Payer: Managed Care, Other (non HMO) | Admitting: Podiatry

## 2018-03-29 ENCOUNTER — Encounter: Payer: Self-pay | Admitting: Podiatry

## 2018-03-29 DIAGNOSIS — M898X7 Other specified disorders of bone, ankle and foot: Secondary | ICD-10-CM

## 2018-03-29 DIAGNOSIS — Z9889 Other specified postprocedural states: Secondary | ICD-10-CM

## 2018-03-29 NOTE — Progress Notes (Signed)
She presents today for her postop visit date of surgery March 10, 2018 derotational arthroplasty fifth digit left foot with a digital exostectomy fifth left.  She states that is a little bit sensitive on the side but seems to be doing well.  Objective: Vital signs are stable alert and oriented x3.  There is no erythema edema cellulitis drainage or odor.  Margins are well coapted sutures are intact.  Assessment: Well-healing surgical foot.  Plan: Demonstrated digital compression dressing which I provided her with Covan.  She will continue to do so I will allow her to get this wet and I will follow-up with her in about 3 weeks to make sure she is doing well.  Were also going to get her a toe cover for her Darco shoe and we will also write her a note to let her go back to work.

## 2018-04-26 ENCOUNTER — Encounter: Payer: Managed Care, Other (non HMO) | Admitting: Podiatry

## 2018-05-22 ENCOUNTER — Encounter

## 2018-05-22 ENCOUNTER — Encounter: Payer: Self-pay | Admitting: Podiatry

## 2018-05-22 ENCOUNTER — Ambulatory Visit: Payer: Managed Care, Other (non HMO)

## 2018-05-22 ENCOUNTER — Ambulatory Visit (INDEPENDENT_AMBULATORY_CARE_PROVIDER_SITE_OTHER): Payer: Managed Care, Other (non HMO) | Admitting: Podiatry

## 2018-05-22 DIAGNOSIS — Z9889 Other specified postprocedural states: Secondary | ICD-10-CM

## 2018-05-22 DIAGNOSIS — M898X7 Other specified disorders of bone, ankle and foot: Secondary | ICD-10-CM

## 2018-05-23 NOTE — Progress Notes (Signed)
She presents today for follow-up of derotational arthroplasty fifth digit left foot exostectomy fifth toe she states is finally getting better date of surgery 03/10/2018.  Tolerated procedure well but states that she still has some swelling.  Objective: Vital signs are stable alert and oriented x3.  Toe appears to be healing very nicely there is mild edema no erythema sialitis drainage odor mild tenderness on the lateral most aspect of the well-healed incision this is more than likely consistent with some scar tissue irritating the digital nerve.  Assessment: Well-healing surgical toe fifth left.  Plan: Follow-up with me on an as-needed basis continue massage therapy and compression therapy as needed.

## 2018-09-04 ENCOUNTER — Other Ambulatory Visit: Payer: Self-pay | Admitting: Nurse Practitioner

## 2018-09-04 DIAGNOSIS — Z1231 Encounter for screening mammogram for malignant neoplasm of breast: Secondary | ICD-10-CM

## 2018-10-16 LAB — CBC AND DIFFERENTIAL
HCT: 38 (ref 36–46)
Hemoglobin: 12.9 (ref 12.0–16.0)
Platelets: 312 (ref 150–399)
WBC: 4.4

## 2018-10-16 LAB — HEPATIC FUNCTION PANEL
ALT: 10 (ref 7–35)
AST: 20 (ref 13–35)
Alkaline Phosphatase: 76 (ref 25–125)

## 2018-10-16 LAB — BASIC METABOLIC PANEL
BUN: 16 (ref 4–21)
CO2: 25 — AB (ref 13–22)
Chloride: 97 — AB (ref 99–108)
Creatinine: 0.7 (ref 0.5–1.1)
Glucose: 84
Potassium: 3.9 (ref 3.4–5.3)
Sodium: 137 (ref 137–147)

## 2018-10-16 LAB — COMPREHENSIVE METABOLIC PANEL
Albumin: 4.5 (ref 3.5–5.0)
Calcium: 9.2 (ref 8.7–10.7)
GFR calc Af Amer: 104
GFR calc non Af Amer: 91
Globulin: 2.2

## 2018-10-16 LAB — LIPID PANEL
Cholesterol: 229 — AB (ref 0–200)
HDL: 71 — AB (ref 35–70)
LDL Cholesterol: 142
Triglycerides: 81 (ref 40–160)

## 2018-10-16 LAB — TSH: TSH: 3.73 (ref 0.41–5.90)

## 2018-10-16 LAB — VITAMIN D 25 HYDROXY (VIT D DEFICIENCY, FRACTURES): Vit D, 25-Hydroxy: 21.3

## 2018-10-16 LAB — CBC: RBC: 4.32 (ref 3.87–5.11)

## 2018-10-23 LAB — HM PAP SMEAR: HM Pap smear: NEGATIVE

## 2018-11-06 ENCOUNTER — Encounter: Payer: Self-pay | Admitting: Family Medicine

## 2018-12-25 ENCOUNTER — Ambulatory Visit
Admission: RE | Admit: 2018-12-25 | Discharge: 2018-12-25 | Disposition: A | Discharge status: Home / Self Care | Payer: Managed Care, Other (non HMO) | Source: Ambulatory Visit | Attending: Nurse Practitioner | Admitting: Nurse Practitioner

## 2018-12-25 ENCOUNTER — Other Ambulatory Visit: Payer: Self-pay

## 2018-12-25 DIAGNOSIS — Z1231 Encounter for screening mammogram for malignant neoplasm of breast: Secondary | ICD-10-CM | POA: Insufficient documentation

## 2019-02-15 LAB — BASIC METABOLIC PANEL
BUN: 18 (ref 4–21)
CO2: 24 — AB (ref 13–22)
Chloride: 98 — AB (ref 99–108)
Creatinine: 0.6 (ref 0.5–1.1)
Glucose: 83
Potassium: 3.8 (ref 3.4–5.3)
Sodium: 138 (ref 137–147)

## 2019-02-15 LAB — LIPID PANEL
Cholesterol: 173 (ref 0–200)
HDL: 72 — AB (ref 35–70)
LDL Cholesterol: 87
Triglycerides: 69 (ref 40–160)

## 2019-02-15 LAB — COMPREHENSIVE METABOLIC PANEL
Albumin: 4.7 (ref 3.5–5.0)
Calcium: 9.8 (ref 8.7–10.7)
GFR calc Af Amer: 108
GFR calc non Af Amer: 94
Globulin: 2.4

## 2019-02-15 LAB — HEPATIC FUNCTION PANEL
ALT: 9 (ref 7–35)
AST: 17 (ref 13–35)
Alkaline Phosphatase: 67 (ref 25–125)

## 2019-02-15 LAB — VITAMIN D 25 HYDROXY (VIT D DEFICIENCY, FRACTURES): Vit D, 25-Hydroxy: 51.6

## 2019-02-15 LAB — VITAMIN B12: Vitamin B-12: 1233

## 2019-02-15 LAB — TSH: TSH: 3.6 (ref 0.41–5.90)

## 2019-06-11 DIAGNOSIS — D485 Neoplasm of uncertain behavior of skin: Secondary | ICD-10-CM | POA: Diagnosis not present

## 2019-06-11 DIAGNOSIS — C4401 Basal cell carcinoma of skin of lip: Secondary | ICD-10-CM | POA: Diagnosis not present

## 2019-07-18 DIAGNOSIS — C4401 Basal cell carcinoma of skin of lip: Secondary | ICD-10-CM | POA: Diagnosis not present

## 2019-07-25 DIAGNOSIS — Z48817 Encounter for surgical aftercare following surgery on the skin and subcutaneous tissue: Secondary | ICD-10-CM | POA: Diagnosis not present

## 2019-08-09 ENCOUNTER — Encounter: Payer: Self-pay | Admitting: Family Medicine

## 2019-08-09 ENCOUNTER — Ambulatory Visit (INDEPENDENT_AMBULATORY_CARE_PROVIDER_SITE_OTHER): Payer: PPO | Admitting: Family Medicine

## 2019-08-09 ENCOUNTER — Other Ambulatory Visit: Payer: Self-pay

## 2019-08-09 VITALS — BP 128/68 | HR 76 | Temp 97.1°F | Resp 16 | Ht 65.0 in | Wt 160.0 lb

## 2019-08-09 DIAGNOSIS — G8929 Other chronic pain: Secondary | ICD-10-CM | POA: Diagnosis not present

## 2019-08-09 DIAGNOSIS — R1032 Left lower quadrant pain: Secondary | ICD-10-CM

## 2019-08-09 DIAGNOSIS — E782 Mixed hyperlipidemia: Secondary | ICD-10-CM | POA: Diagnosis not present

## 2019-08-09 DIAGNOSIS — M25561 Pain in right knee: Secondary | ICD-10-CM | POA: Diagnosis not present

## 2019-08-09 DIAGNOSIS — Z1159 Encounter for screening for other viral diseases: Secondary | ICD-10-CM

## 2019-08-09 DIAGNOSIS — I10 Essential (primary) hypertension: Secondary | ICD-10-CM | POA: Diagnosis not present

## 2019-08-09 DIAGNOSIS — M533 Sacrococcygeal disorders, not elsewhere classified: Secondary | ICD-10-CM | POA: Diagnosis not present

## 2019-08-09 DIAGNOSIS — E663 Overweight: Secondary | ICD-10-CM

## 2019-08-09 DIAGNOSIS — Z803 Family history of malignant neoplasm of breast: Secondary | ICD-10-CM | POA: Diagnosis not present

## 2019-08-09 DIAGNOSIS — Z83719 Family history of colon polyps, unspecified: Secondary | ICD-10-CM

## 2019-08-09 DIAGNOSIS — Z8371 Family history of colonic polyps: Secondary | ICD-10-CM | POA: Diagnosis not present

## 2019-08-09 NOTE — Progress Notes (Signed)
Patient: Mckenzie Willis Female    DOB: 02-Nov-1950   69 y.o.   MRN: [id number] Visit Date: 08/09/2019  Today's Provider: Lavon Paganini, MD   Chief Complaint  Patient presents with  . New Patient (Initial Visit)  . Establish Care   Subjective:     HPI New Patient Establishing Care: Patient is here today to establish Care. Former PCP: [facility]. Reason for leaving: wanting a new facility.  3-4 weeks ago, had irregular feeling that wasn't truly pain in LLQ.  It comes and goes, but is overall better.  Occurred last night.  None at all today.  Describes it as "almost liek a small pain" and dull  Has not tried anything for it.  Denies N/V/C/D. No aggravating or alleviating factors that she knows of.  HTN: - Medications: losartan-HCTZ 100-25mg  daily - Compliance: good - Checking BP at home: yes, typically 130s-140s/70 - Denies any SOB, CP, vision changes, LE edema, medication SEs, or symptoms of hypotension - Diet: Thinks that she could do better at decreasing sodium in her diet - Exercise: None regularly, but tries to stay active  HLD - medications: rosuvastatin 10 mg  - compliance: good, but only in the last month - medication SEs: None  Reports OA in R knee and low back.  Walking for exercise, but notices back pain when walking up an incline.  Was recommended to see PT previously, but has not yet.  Uses voltaren gel prn.  Back pain is over R SI joint/.  Worse with activity.  Has not tried any medications for this or other treatments.  A little better with bending forward sometimes.  Family history of breast cancer.  Getting regularly mammograms.  No history of abnormal mammogram  Mother had colon polyps, so she has colonoscopies q21yr.  Last in 2015.  Missed 2020 one due to [place] pandemic.   No Known Allergies   Current Outpatient Medications:  .  Cholecalciferol (VITAMIN D-3) 25 MCG (1000 UT) CAPS, Take 1 capsule by mouth daily., Disp: , Rfl:  .   diclofenac Sodium (VOLTAREN) 1 % GEL, Apply topically 4 (four) times daily., Disp: , Rfl:  .  losartan-hydrochlorothiazide (HYZAAR) 100-25 MG tablet, TAKE 1 TABLET BY MOUTH  DAILY, Disp: 60 tablet, Rfl: 3 .  rosuvastatin (CRESTOR) 10 MG tablet, Take 1 tablet by mouth daily., Disp: , Rfl:  .  TURMERIC PO, Take 1,000 mg by mouth daily., Disp: , Rfl:  .  vitamin B-12 (CYANOCOBALAMIN) 1000 MCG tablet, Take 1,000 mcg by mouth daily., Disp: , Rfl:   Review of Systems  Constitutional: Negative for appetite change, chills, fatigue and fever.  HENT: Positive for sneezing.        Left ear itchy  Respiratory: Negative for chest tightness and shortness of breath.   Cardiovascular: Negative for chest pain and palpitations.  Gastrointestinal: Negative for abdominal pain, nausea and vomiting.  Musculoskeletal: Positive for arthralgias and back pain.  Neurological: Negative for dizziness and weakness.    Social History   Tobacco Use  . Smoking status: Former Research scientist (life sciences)  . Smokeless tobacco: Never Used  Substance Use Topics  . Alcohol use: Yes    Alcohol/week: 0.0 standard drinks    Comment: Occ glass of wine    Past Medical History:  Diagnosis Date  . Arthritis   . History of chicken pox   . Hyperlipidemia   . Hypertension     Past Surgical History:  Procedure Laterality Date  . TOE  SURGERY  2018   Dr Milinda Pointer  . TUBAL LIGATION       Family History  Problem Relation Age of Onset  . Hypertension Mother   . Heart disease Mother        s/p CABG in 44s  . Colon polyps Mother   . Hypertension Brother   . Cancer Brother        Melanoma   . Hypertension Maternal Grandmother   . Breast cancer Paternal Aunt 40  . Emphysema Father   . Diabetes Paternal Grandfather   . Breast cancer Maternal Aunt        great aunt  . Colon cancer Neg Hx       Objective:   BP (!) 142/68 (BP Location: Right Arm, Cuff Size: Normal)   Pulse 76   Temp (!) 97.1 F (36.2 C) (Temporal)   Resp 16   Ht 5\' 5"   (1.651 m)   Wt 160 lb (72.6 kg)   SpO2 98% Comment: room air  BMI 26.63 kg/m  Vitals:   08/09/19 1449 08/09/19 1513  BP: (!) 148/70 (!) 142/68  Pulse: 76   Resp: 16   Temp: (!) 97.1 F (36.2 C)   TempSrc: Temporal   SpO2: 98%   Weight: 160 lb (72.6 kg)   Height: 5\' 5"  (1.651 m)   Body mass index is 26.63 kg/m.   Physical Exam Vitals reviewed.  Constitutional:      General: She is not in acute distress.    Appearance: Normal appearance. She is well-developed. She is not diaphoretic.  HENT:     Head: Normocephalic and atraumatic.     Right Ear: Tympanic membrane, ear canal and external ear normal.     Left Ear: Tympanic membrane, ear canal and external ear normal.  Eyes:     General: No scleral icterus.    Conjunctiva/sclera: Conjunctivae normal.     Pupils: Pupils are equal, round, and reactive to light.  Neck:     Thyroid: No thyromegaly.  Cardiovascular:     Rate and Rhythm: Normal rate and regular rhythm.     Pulses: Normal pulses.     Heart sounds: Normal heart sounds. No murmur.  Pulmonary:     Effort: Pulmonary effort is normal. No respiratory distress.     Breath sounds: Normal breath sounds. No wheezing or rales.  Abdominal:     General: Bowel sounds are normal. There is no distension.     Palpations: Abdomen is soft.     Tenderness: There is no abdominal tenderness. There is no guarding or rebound.  Musculoskeletal:        General: No deformity.     Cervical back: Neck supple.     Right lower leg: No edema.     Left lower leg: No edema.     Comments: R Knee: Normal to inspection with no erythema or effusion or obvious bony abnormalities.  Palpation normal with no warmth or joint line tenderness or patellar tenderness or condyle tenderness. ROM normal in flexion and extension and lower leg rotation. Ligaments with solid consistent endpoints including ACL, PCL, LCL, MCL. Negative Mcmurray's and provocative meniscal tests. Non painful patellar  compression. Patellar and quadriceps tendons unremarkable. Hamstring and quadriceps strength is normal.  Back: No midline tenderness to palpation.  Minimal tenderness palpation around right SI joint and right gluteus muscles.  Range of motion is intact.  Gait is intact.  Negative straight leg raise bilaterally.  Strength and sensation intact in lower  extremities.  Lymphadenopathy:     Cervical: No cervical adenopathy.  Skin:    General: Skin is warm and dry.     Capillary Refill: Capillary refill takes less than 2 seconds.     Findings: No rash.  Neurological:     Mental Status: She is alert and oriented to person, place, and time. Mental status is at baseline.  Psychiatric:        Mood and Affect: Mood normal.        Behavior: Behavior normal.        Thought Content: Thought content normal.      No results found for any visits on 08/09/19.     Assessment & Plan    Problem List Items Addressed This Visit      Cardiovascular and Mediastinum   Benign essential HTN    Well controlled Continue current medications Recheck metabolic panel F/u in 6 months       Relevant Medications   rosuvastatin (CRESTOR) 10 MG tablet   Other Relevant Orders   CMP (Comprehensive metabolic panel)     Other   HLD (hyperlipidemia)    She is only been on Crestor for 1 month She has reservations about taking a statin, as she has heard some bad things about side effects She does not currently have any side effects We discussed that statins are well tolerated and they are extensive benefit Discussed that she can use co-Q10 if she develops any myalgias She will continue Crestor Recheck CMP and FLP today      Relevant Medications   rosuvastatin (CRESTOR) 10 MG tablet   Other Relevant Orders   Lipid panel   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    Patient with longstanding right SI joint pain with no radiculopathy symptoms She has never been to see physical therapy She can continue Voltaren  gel as needed We will refer her to physical therapy Discussed ice and rest      Relevant Orders   Ambulatory referral to Physical Therapy   Chronic pain of right knee    Longstanding issue Likely osteoarthritis We will hold off on imaging, because she believes she has had imaging previously ROI sent to previous PCP for this and other records Continue Voltaren gel as needed Discussed importance of quad strengthening Physical therapy may be able to help with this as well      Relevant Orders   Ambulatory referral to Physical Therapy   LLQ pain    New problem that is now mostly resolved No abnormalities noted on exam Discussed red flag symptoms and return/ER precautions      Overweight    Discussed importance of healthy weight management Discussed diet and exercise      Family history of breast cancer    Reviewed last mammogram Continue annual mammogram for screening Breast exam at physical      Family history of colonic polyps    Reviewed last colonoscopy from 2015 Continue every 5-year screening colonoscopies She is due for her next colonoscopy, but has postponed it due to the current pandemic At upcoming CPE, we will refer to GI for next colonoscopy       Other Visit Diagnoses    Need for hepatitis C screening test       Relevant Orders   Hepatitis C Antibody     ROI sent ot previous PCP  Return in about 3 months (around 11/07/2019) for Welcome to Medicare.  Total time spent on today's  visit was greater than 45 minutes, including both face-to-face time and nonface-to-face time personally spent on review of chart (labs and imaging), discussing labs and goals, discussing further work-up, treatment options, referrals to specialist if needed, reviewing outside records of pertinent, answering patient's questions, and coordinating care.   The entirety of the information documented in the History of Present Illness, Review of Systems and Physical Exam were personally  obtained by me. Portions of this information were initially documented by Meyer Cory, CMA and reviewed by me for thoroughness and accuracy.    Nikkia Devoss, Dionne Bucy, MD MPH [facility]

## 2019-08-09 NOTE — Patient Instructions (Signed)
DASH Eating Plan DASH stands for "Dietary Approaches to Stop Hypertension." The DASH eating plan is a healthy eating plan that has been shown to reduce high blood pressure (hypertension). It may also reduce your risk for type 2 diabetes, heart disease, and stroke. The DASH eating plan may also help with weight loss. What are tips for following this plan?  General guidelines  Avoid eating more than 2,300 mg (milligrams) of salt (sodium) a day. If you have hypertension, you may need to reduce your sodium intake to 1,500 mg a day.  Limit alcohol intake to no more than 1 drink a day for nonpregnant women and 2 drinks a day for men. One drink equals 12 oz of beer, 5 oz of wine, or 1 oz of hard liquor.  Work with your health care provider to maintain a healthy body weight or to lose weight. Ask what an ideal weight is for you.  Get at least 30 minutes of exercise that causes your heart to beat faster (aerobic exercise) most days of the week. Activities may include walking, swimming, or biking.  Work with your health care provider or diet and nutrition specialist (dietitian) to adjust your eating plan to your individual calorie needs. Reading food labels   Check food labels for the amount of sodium per serving. Choose foods with less than 5 percent of the Daily Value of sodium. Generally, foods with less than 300 mg of sodium per serving fit into this eating plan.  To find whole grains, look for the word "whole" as the first word in the ingredient list. Shopping  Buy products labeled as "low-sodium" or "no salt added."  Buy fresh foods. Avoid canned foods and premade or frozen meals. Cooking  Avoid adding salt when cooking. Use salt-free seasonings or herbs instead of table salt or sea salt. Check with your health care provider or pharmacist before using salt substitutes.  Do not fry foods. Cook foods using healthy methods such as baking, boiling, grilling, and broiling instead.  Cook with  heart-healthy oils, such as olive, canola, soybean, or sunflower oil. Meal planning  Eat a balanced diet that includes: ? 5 or more servings of fruits and vegetables each day. At each meal, try to fill half of your plate with fruits and vegetables. ? Up to 6-8 servings of whole grains each day. ? Less than 6 oz of lean meat, poultry, or fish each day. A 3-oz serving of meat is about the same size as a deck of cards. One egg equals 1 oz. ? 2 servings of low-fat dairy each day. ? A serving of nuts, seeds, or beans 5 times each week. ? Heart-healthy fats. Healthy fats called Omega-3 fatty acids are found in foods such as flaxseeds and coldwater fish, like sardines, salmon, and mackerel.  Limit how much you eat of the following: ? Canned or prepackaged foods. ? Food that is high in trans fat, such as fried foods. ? Food that is high in saturated fat, such as fatty meat. ? Sweets, desserts, sugary drinks, and other foods with added sugar. ? Full-fat dairy products.  Do not salt foods before eating.  Try to eat at least 2 vegetarian meals each week.  Eat more home-cooked food and less restaurant, buffet, and fast food.  When eating at a restaurant, ask that your food be prepared with less salt or no salt, if possible. What foods are recommended? The items listed may not be a complete list. Talk with your dietitian about   what dietary choices are best for you. Grains Whole-grain or whole-wheat bread. Whole-grain or whole-wheat pasta. Brown rice. Oatmeal. Quinoa. Bulgur. Whole-grain and low-sodium cereals. Pita bread. Low-fat, low-sodium crackers. Whole-wheat flour tortillas. Vegetables Fresh or frozen vegetables (raw, steamed, roasted, or grilled). Low-sodium or reduced-sodium tomato and vegetable juice. Low-sodium or reduced-sodium tomato sauce and tomato paste. Low-sodium or reduced-sodium canned vegetables. Fruits All fresh, dried, or frozen fruit. Canned fruit in natural juice (without  added sugar). Meat and other protein foods Skinless chicken or turkey. Ground chicken or turkey. Pork with fat trimmed off. Fish and seafood. Egg whites. Dried beans, peas, or lentils. Unsalted nuts, nut butters, and seeds. Unsalted canned beans. Lean cuts of beef with fat trimmed off. Low-sodium, lean deli meat. Dairy Low-fat (1%) or fat-free (skim) milk. Fat-free, low-fat, or reduced-fat cheeses. Nonfat, low-sodium ricotta or cottage cheese. Low-fat or nonfat yogurt. Low-fat, low-sodium cheese. Fats and oils Soft margarine without trans fats. Vegetable oil. Low-fat, reduced-fat, or light mayonnaise and salad dressings (reduced-sodium). Canola, safflower, olive, soybean, and sunflower oils. Avocado. Seasoning and other foods Herbs. Spices. Seasoning mixes without salt. Unsalted popcorn and pretzels. Fat-free sweets. What foods are not recommended? The items listed may not be a complete list. Talk with your dietitian about what dietary choices are best for you. Grains Baked goods made with fat, such as croissants, muffins, or some breads. Dry pasta or rice meal packs. Vegetables Creamed or fried vegetables. Vegetables in a cheese sauce. Regular canned vegetables (not low-sodium or reduced-sodium). Regular canned tomato sauce and paste (not low-sodium or reduced-sodium). Regular tomato and vegetable juice (not low-sodium or reduced-sodium). Pickles. Olives. Fruits Canned fruit in a light or heavy syrup. Fried fruit. Fruit in cream or butter sauce. Meat and other protein foods Fatty cuts of meat. Ribs. Fried meat. Bacon. Sausage. Bologna and other processed lunch meats. Salami. Fatback. Hotdogs. Bratwurst. Salted nuts and seeds. Canned beans with added salt. Canned or smoked fish. Whole eggs or egg yolks. Chicken or turkey with skin. Dairy Whole or 2% milk, cream, and half-and-half. Whole or full-fat cream cheese. Whole-fat or sweetened yogurt. Full-fat cheese. Nondairy creamers. Whipped toppings.  Processed cheese and cheese spreads. Fats and oils Butter. Stick margarine. Lard. Shortening. Ghee. Bacon fat. Tropical oils, such as coconut, palm kernel, or palm oil. Seasoning and other foods Salted popcorn and pretzels. Onion salt, garlic salt, seasoned salt, table salt, and sea salt. Worcestershire sauce. Tartar sauce. Barbecue sauce. Teriyaki sauce. Soy sauce, including reduced-sodium. Steak sauce. Canned and packaged gravies. Fish sauce. Oyster sauce. Cocktail sauce. Horseradish that you find on the shelf. Ketchup. Mustard. Meat flavorings and tenderizers. Bouillon cubes. Hot sauce and Tabasco sauce. Premade or packaged marinades. Premade or packaged taco seasonings. Relishes. Regular salad dressings. Where to find more information:  National Heart, Lung, and Blood Institute: www.nhlbi.nih.gov  American Heart Association: www.heart.org Summary  The DASH eating plan is a healthy eating plan that has been shown to reduce high blood pressure (hypertension). It may also reduce your risk for type 2 diabetes, heart disease, and stroke.  With the DASH eating plan, you should limit salt (sodium) intake to 2,300 mg a day. If you have hypertension, you may need to reduce your sodium intake to 1,500 mg a day.  When on the DASH eating plan, aim to eat more fresh fruits and vegetables, whole grains, lean proteins, low-fat dairy, and heart-healthy fats.  Work with your health care provider or diet and nutrition specialist (dietitian) to adjust your eating plan to your   individual calorie needs. This information is not intended to replace advice given to you by your health care provider. Make sure you discuss any questions you have with your health care provider. Document Revised: 06/24/2017 Document Reviewed: 07/05/2016 Elsevier Patient Education  2020 Elsevier Inc.  

## 2019-08-11 DIAGNOSIS — G8929 Other chronic pain: Secondary | ICD-10-CM | POA: Insufficient documentation

## 2019-08-11 DIAGNOSIS — R1032 Left lower quadrant pain: Secondary | ICD-10-CM | POA: Insufficient documentation

## 2019-08-11 DIAGNOSIS — E663 Overweight: Secondary | ICD-10-CM | POA: Insufficient documentation

## 2019-08-11 DIAGNOSIS — Z8371 Family history of colonic polyps: Secondary | ICD-10-CM | POA: Insufficient documentation

## 2019-08-11 DIAGNOSIS — Z803 Family history of malignant neoplasm of breast: Secondary | ICD-10-CM | POA: Insufficient documentation

## 2019-08-11 NOTE — Assessment & Plan Note (Signed)
Longstanding issue Likely osteoarthritis We will hold off on imaging, because she believes she has had imaging previously ROI sent to previous PCP for this and other records Continue Voltaren gel as needed Discussed importance of quad strengthening Physical therapy may be able to help with this as well

## 2019-08-11 NOTE — Assessment & Plan Note (Addendum)
Patient with longstanding right SI joint pain with no radiculopathy symptoms She has never been to see physical therapy She can continue Voltaren gel as needed We will refer her to physical therapy Discussed ice and rest

## 2019-08-11 NOTE — Assessment & Plan Note (Signed)
She is only been on Crestor for 1 month She has reservations about taking a statin, as she has heard some bad things about side effects She does not currently have any side effects We discussed that statins are well tolerated and they are extensive benefit Discussed that she can use co-Q10 if she develops any myalgias She will continue Crestor Recheck CMP and FLP today

## 2019-08-11 NOTE — Assessment & Plan Note (Signed)
New problem that is now mostly resolved No abnormalities noted on exam Discussed red flag symptoms and return/ER precautions

## 2019-08-11 NOTE — Assessment & Plan Note (Signed)
Well controlled Continue current medications Recheck metabolic panel F/u in 6 months  

## 2019-08-11 NOTE — Assessment & Plan Note (Signed)
Reviewed last mammogram Continue annual mammogram for screening Breast exam at physical

## 2019-08-11 NOTE — Assessment & Plan Note (Signed)
Discussed importance of healthy weight management Discussed diet and exercise  

## 2019-08-11 NOTE — Assessment & Plan Note (Signed)
Reviewed last colonoscopy from 2015 Continue every 5-year screening colonoscopies She is due for her next colonoscopy, but has postponed it due to the current pandemic At upcoming CPE, we will refer to GI for next colonoscopy

## 2019-08-15 DIAGNOSIS — Z48817 Encounter for surgical aftercare following surgery on the skin and subcutaneous tissue: Secondary | ICD-10-CM | POA: Diagnosis not present

## 2019-08-15 DIAGNOSIS — L905 Scar conditions and fibrosis of skin: Secondary | ICD-10-CM | POA: Diagnosis not present

## 2019-08-17 ENCOUNTER — Encounter: Payer: Self-pay | Admitting: Family Medicine

## 2019-08-23 DIAGNOSIS — L508 Other urticaria: Secondary | ICD-10-CM | POA: Diagnosis not present

## 2019-08-27 ENCOUNTER — Encounter: Payer: Self-pay | Admitting: Physical Therapy

## 2019-08-27 ENCOUNTER — Ambulatory Visit: Payer: PPO | Attending: Family Medicine | Admitting: Physical Therapy

## 2019-08-27 ENCOUNTER — Other Ambulatory Visit: Payer: Self-pay

## 2019-08-27 DIAGNOSIS — M6281 Muscle weakness (generalized): Secondary | ICD-10-CM | POA: Diagnosis not present

## 2019-08-27 DIAGNOSIS — M545 Low back pain: Secondary | ICD-10-CM | POA: Insufficient documentation

## 2019-08-27 DIAGNOSIS — G8929 Other chronic pain: Secondary | ICD-10-CM

## 2019-08-27 DIAGNOSIS — R293 Abnormal posture: Secondary | ICD-10-CM | POA: Insufficient documentation

## 2019-08-27 NOTE — Therapy (Signed)
[facility] [address] [place], [place], [address] Phone: [phone]   Fax:  [phone]  Physical Therapy Evaluation  Patient Details  Name: Mckenzie Willis MRN: [id number] Date of Birth: 15-Dec-1950 Referring Provider (PT): Lavon Paganini   Encounter Date: 08/27/2019  PT End of Session - 08/27/19 1002    Visit Number  1    Number of Visits  13    Date for PT Re-Evaluation  11/19/19    PT Start Time  1000    PT Stop Time  1055    PT Time Calculation (min)  55 min    Activity Tolerance  Patient tolerated treatment well    Behavior During Therapy  [facility] for tasks assessed/performed       Past Medical History:  Diagnosis Date  . Arthritis   . History of chicken pox   . Hyperlipidemia   . Hypertension     Past Surgical History:  Procedure Laterality Date  . TOE SURGERY  2018   Dr Milinda Pointer  . TUBAL LIGATION      There were no vitals filed for this visit.    [facility] PT Assessment - 08/27/19 0001      Assessment   Medical Diagnosis  SIJ Pain    Referring Provider (PT)  Lavon Paganini    Onset Date/Surgical Date  08/26/17    Hand Dominance  Right    Prior Therapy  None      Precautions   Precautions  None      Balance Screen   Has the patient fallen in the past 6 months  No      Prior Function   Vocation  Retired    Leisure  garden      SUBJECTIVE Chief complaint:  Patient reports pain with walking on R side of low back specifically on inclines. Patient does a lot of walking and stopping to allow pain to ease off. Patient has a history of pain with walking (plantar fasciitis, R knee pain, R low back pain) Knee pain started 3 years prior (2018). 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 Patient feels confident she has some arthritic changes in her feet due to top of the foot joint pain. Patient notes that she did fall off a pony roughly 50 years ago. Patient notes that she did fall in her  late 22s while skating and landing squarely on her tailbone. A few years ago, patient stubbed toe and fell in home. Patient notes some mild LLQ/groin pain.   Pain: 0/10 Present, 0/10 Best, 9/10 Worst Aggravating factors: steps, hills, bending to pick up objects Easing factors: forward flexion/forward fold 24 hour pain behavior: occasional pain with bed mobility; dependent on activity How long can you sit: unlimited (pain and stiffness with transition from sitting) How long can you stand: unlimtied How long can you walk: fast paced 30 min; hill < 5 min Recent back trauma: No Prior history of back injury or pain: Yes Pain quality: pain quality: pull/stretch Radiating pain: Yes (to the R knee) Numbness/Tingling: No Follow-up appointment with MD: Yes (April 2021)  Hobbies: gardening Goals: get back to walking; build strength;  Red flags (bowel/bladder changes, saddle paresthesia, personal history of cancer, chills/fever, night sweats, unrelenting pain, first onset of insidious LBP <20 y/o) Negative    OBJECTIVE  Mental Status Patient is oriented to person, place and time.  Recent memory is intact.  Remote memory is intact.  Attention  span and concentration are intact.  Expressive speech is intact.  Patient's fund of knowledge is within normal limits for educational level.  SENSATION: Grossly intact to light touch bilateral LEs as determined by testing dermatomes L2-S2 Proprioception and hot/cold testing deferred on this date   MUSCULOSKELETAL: Tremor: None Bulk: Normal Tone: Normal No visible step-off along spinal column  Posture Lumbar lordosis: WNL Iliac crest height: L elevated (R PSIS shifted posteriorly) Lumbar lateral shift: negative Occiput-Wall Distance: 9 cm  Gait WNL, no gross abnormalities; mildly increased L lateral flexion    Palpation No TTP over the SIJ, lumbar region. Abdominal palpation non-tender.    Strength (out of 5) R/L 3+/4 Hip flexion 5/5  Hip abduction (seated) 3+/5 Hip adduction (seated) 5/5 Knee extension 3+/4 Knee flexion 5/5 Ankle dorsiflexion 5/5 Ankle plantarflexion *Indicates pain   AROM (degrees) R/L (all movements include overpressure unless otherwise stated) Lumbar forward flexion (65): negligible lumbar flexion Lumbar extension (30): WFL Lumbar lateral flexion (25): R < L Thoracic and Lumbar rotation (30 degrees):  B: WNL, R* *Indicates pain  Repeated Movements No centralization or peripheralization of symptoms with repeated lumbar extension or flexion.    Passive Accessory Intervertebral Motion (PAIVM) Patient denies reproduction of back pain with CPA L1-L5 and UPA bilaterally L1-L5. Generally hypomobile throughout   SPECIAL TESTS Facet Joint: Extension-Rotation (SN 100, -LR 0.0): R: Negative L: Negative  Lumbar Spinal Stenosis: Lumbar quadrant (SN 70): R: Negative L: Negative  SIJ:  Stork : R: Positive L: Positive Leg length discrepancy: Positive L 87.5 cm, R 83 cm  Functional Tasks Sit to stand: WFL, use of BUE though not reliant; no increased genu valgum with transfer   ASSESSMENT Patient is a 69 year old presenting to clinic with chief complaints of R sided SIJ pain with radiating symptoms to the R knee. Upon examination, patient demonstrates deficits in SIJ motion B, pelvic posture, lumbar spine ROM, RLE strength, and balance as evidenced by (+) Stork test, elevated L iliac crest with posterior shift of R PSIS, negligible lumbar flexion, 3+/5 RLE strength, and SLS < 5 sec. Patient's responses on FOTO outcome measures (55) indicate moderate functional limitations/disability/distress. Patient's progress may be limited due to persistence of pain; however, patient's motivation is advantageous. Patient was able to achieve adequate form performing articulating bridge during today's evaluation and responded positively to educational interventions. Patient will benefit from continued skilled therapeutic  intervention to address deficits in SIJ motion B, pelvic posture, lumbar spine ROM, RLE strength, and balance in order to  increase function and improve overall QOL.     Objective measurements completed on examination: See above findings.    TREATMENT  Neuromuscular Re-education: R heel lift gait in hallway for decreased R SIJ load 2/2 to leg length discrepancy Spinal articulation bridge for improved postural awareness and joint opening of the lumbar/SIJ region   Patient educated on prognosis, POC, and provided with HEP including: graded activity with R heel lift, spinal articulation bridge. Patient articulated understanding and returned demonstration. Patient will benefit from further education in order to maximize compliance and understanding for long-term therapeutic gains.       PT Long Term Goals - 08/27/19 1327      PT LONG TERM GOAL #1   Title  Patient will be independent with HEP in order to improve strength and decrease back pain in order to improve pain-free function at home and work.    Baseline  IE: provided    Time  12  Period  Weeks    Status  New    Target Date  11/19/19      PT LONG TERM GOAL #2   Title  Patient will demonstrate improved function as evidenced by a score of 68 on FOTO measure for full participation in activities at home and in the community.    Baseline  IE: 51    Time  12    Period  Weeks    Status  New    Target Date  11/19/19      PT LONG TERM GOAL #3   Title  Patient will demonstrate ability to walk on an inclined surface with pain < 4/10 NPRS for duration > 5 minutes in order to participate in preferred wellness activities.    Baseline  IE: unable    Time  12    Period  Weeks    Status  New    Target Date  11/19/19      PT LONG TERM GOAL #4   Title  Patient will decrease worst back pain as reported on NPRS by at least 2 points in order to demonstrate clinically significant reduction in back pain.    Baseline  IE: 9/10    Time   12    Period  Weeks    Status  New    Target Date  11/19/19      PT LONG TERM GOAL #5   Title  Patient will increase strength of RLE by at least 1/2 MMT grade in order to demonstrate improvement in strength and function.    Baseline  IE: 3+/5    Time  12    Period  Weeks    Status  New    Target Date  11/19/19      Additional Long Term Goals   Additional Long Term Goals  Yes      PT LONG TERM GOAL #6   Title  Patient will demonstrate bone density appropriate body mechanics for lifting, bending, and twisting activities to maintain bone health and decrease risk of fracture.    Baseline  IE: not demonstrated    Time  12    Period  Weeks    Status  New    Target Date  11/19/19             Plan - 08/27/19 1003    Clinical Impression Statement  Patient is a 69 year old presenting to clinic with chief complaints of R sided SIJ pain with radiating symptoms to the R knee. Upon examination, patient demonstrates deficits in SIJ motion B, pelvic posture, lumbar spine ROM, RLE strength, and balance as evidenced by (+) Stork test, elevated L iliac crest with posterior shift of R PSIS, negligible lumbar flexion, 3+/5 RLE strength, and SLS < 5 sec. Patient's responses on FOTO outcome measures (55) indicate moderate functional limitations/disability/distress. Patient's progress may be limited due to persistence of pain; however, patient's motivation is advantageous. Patient was able to achieve adequate form performing articulating bridge during today's evaluation and responded positively to educational interventions. Patient will benefit from continued skilled therapeutic intervention to address deficits in SIJ motion B, pelvic posture, lumbar spine ROM, RLE strength, and balance in order to  increase function and improve overall QOL.    Personal Factors and Comorbidities  Age;Fitness;Past/Current Experience;Behavior Pattern;Comorbidity 3+;Sex;Profession;Time since onset of  injury/illness/exacerbation    Comorbidities  Arthritis, Hyperlipidemia, Hypertension    Examination-Activity Limitations  Bed Mobility;Squat;Bend;Lift;Locomotion Level;Carry;Transfers;Sleep    Examination-Participation Restrictions  Church;Meal  Prep;Cleaning;Community Activity;[facility]    Stability/Clinical Decision Making  Evolving/Moderate complexity    Clinical Decision Making  Moderate    Rehab Potential  Fair    PT Frequency  1x / week    PT Duration  12 weeks    PT Treatment/Interventions  ADLs/Self Care Home Management;Cryotherapy;Moist Heat;Iontophoresis 4mg /ml Dexamethasone;Electrical Stimulation;Neuromuscular re-education;Balance training;Therapeutic exercise;Therapeutic activities;Functional mobility training;Stair training;Gait training;Patient/family education;Manual techniques;Joint Manipulations;Spinal Manipulations;Taping;Scar mobilization;Dry needling;Passive range of motion;Energy conservation    PT Next Visit Plan  postural awareness and thoracic extension    PT Home Exercise Plan  Bridge with spinal articulation; heel lift graded activity    Consulted and Agree with Plan of Care  Patient       Patient will benefit from skilled therapeutic intervention in order to improve the following deficits and impairments:  Abnormal gait, Decreased balance, Decreased mobility, Difficulty walking, Pain, Postural dysfunction, Impaired flexibility, Decreased strength, Decreased activity tolerance, Decreased range of motion, Improper body mechanics, Decreased scar mobility, Hypomobility  Visit Diagnosis: Abnormal posture  Muscle weakness (generalized)  Chronic low back pain, unspecified back pain laterality, unspecified whether sciatica present     Problem List Patient Active Problem List   Diagnosis Date Noted  . 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 Chronic pain of right knee 08/11/2019  . LLQ pain 08/11/2019  . Overweight 08/11/2019  . Family history of breast  cancer 08/11/2019  . Family history of colonic polyps 08/11/2019  . Routine general medical examination at a health care facility 03/24/2015  . Hypothyroidism 03/24/2015  . Thyroid nodule 03/24/2015  . Lichen sclerosus [id number]  . Benign essential HTN 10/04/2013  . HLD (hyperlipidemia) 10/04/2013   Myles Gip PT, DPT [phone] 08/27/2019, 1:36 PM  [facility] [address] [place], [place], [address] Phone: [phone]   Fax:  [phone]  Name: Mckenzie Willis MRN: [id number] Date of Birth: 1951/02/08

## 2019-09-03 ENCOUNTER — Ambulatory Visit: Payer: PPO | Admitting: Physical Therapy

## 2019-09-03 ENCOUNTER — Other Ambulatory Visit: Payer: Self-pay

## 2019-09-03 ENCOUNTER — Encounter: Payer: Self-pay | Admitting: Physical Therapy

## 2019-09-03 DIAGNOSIS — M6281 Muscle weakness (generalized): Secondary | ICD-10-CM

## 2019-09-03 DIAGNOSIS — M545 Low back pain, unspecified: Secondary | ICD-10-CM

## 2019-09-03 DIAGNOSIS — R293 Abnormal posture: Secondary | ICD-10-CM

## 2019-09-03 DIAGNOSIS — G8929 Other chronic pain: Secondary | ICD-10-CM

## 2019-09-03 NOTE — Therapy (Signed)
[facility] [address] [place], [place], [address] Phone: [phone]   Fax:  [phone]  Physical Therapy Treatment  Patient Details  Name: Mckenzie Willis MRN: [id number] Date of Birth: 01-07-1951 Referring Provider (PT): Lavon Paganini   Encounter Date: 09/03/2019  PT End of Session - 09/03/19 1011    Visit Number  2    Number of Visits  13    Date for PT Re-Evaluation  11/19/19    PT Start Time  1000    PT Stop Time  1055    PT Time Calculation (min)  55 min    Activity Tolerance  Patient tolerated treatment well    Behavior During Therapy  [facility] for tasks assessed/performed       Past Medical History:  Diagnosis Date  . Arthritis   . History of chicken pox   . Hyperlipidemia   . Hypertension     Past Surgical History:  Procedure Laterality Date  . TOE SURGERY  2018   Dr Milinda Pointer  . TUBAL LIGATION      There were no vitals filed for this visit.  Subjective Assessment - 09/03/19 1000    Subjective  Patient notes that she had a nice birthday and went walking with her daughter (10/10 fleeting; resolves with rest). She noticed that she had pain in her back from walking and thinks it might be related to distance and speed. Patient notes she has been doing her HEP and has some soreness from those but attributes that to it being new activity.    Currently in Pain?  No/denies      TREATMENT  Pre-treatment assessment: R PSIS posterior  Manual Therapy: STM and TPR performed to B gluteals and sacral ligaments to allow for decreased tension and pain and improved posture and function Sacral mobilizations for decreased spasm and improved mobility, grade II/III  Neuromuscular Re-education: Morning Stretching/Anti-stiffness Routine:  Supine ankle pumps/circles  Supine heel slides  Supine posterior pelvic tilts  Supine hooklying trunk rotations  Supine knee to chest, BLE  Supine double knee to chest Sidelying heel  squeezes for improved force closure of SIJ Sidelying R clamshell for improved force closure of SIJ Sit to stand, R bias for increased R gluteal activation Standing R hip extension for increased R gluteal activation   Patient educated throughout session on appropriate technique and form using multi-modal cueing, HEP, and activity modification. Patient articulated understanding and returned demonstration.  Patient Response to interventions: Patient notes she feels looser on L glute/sacral complex  ASSESSMENT Patient presents to clinic with excellent motivation to participate in therapy. Patient demonstrates deficits in SIJ motion B, pelvic posture, lumbar spine ROM, RLE strength, and balance. Patient able to achieve reduced L glute tension with manual and active interventions during today's session and responded positively to active interventions. Patient will benefit from continued skilled therapeutic intervention to address remaining deficits in SIJ motion B, pelvic posture, lumbar spine ROM, RLE strength, and balance in order to increase function and improve overall QOL.   PT Long Term Goals - 08/27/19 1327      PT LONG TERM GOAL #1   Title  Patient will be independent with HEP in order to improve strength and decrease back pain in order to improve pain-free function at home and work.    Baseline  IE: provided    Time  12    Period  Weeks    Status  New    Target Date  11/19/19      PT LONG TERM GOAL #2   Title  Patient will demonstrate improved function as evidenced by a score of 68 on FOTO measure for full participation in activities at home and in the community.    Baseline  IE: 67    Time  12    Period  Weeks    Status  New    Target Date  11/19/19      PT LONG TERM GOAL #3   Title  Patient will demonstrate ability to walk on an inclined surface with pain < 4/10 NPRS for duration > 5 minutes in order to participate in preferred wellness activities.    Baseline  IE: unable     Time  12    Period  Weeks    Status  New    Target Date  11/19/19      PT LONG TERM GOAL #4   Title  Patient will decrease worst back pain as reported on NPRS by at least 2 points in order to demonstrate clinically significant reduction in back pain.    Baseline  IE: 9/10    Time  12    Period  Weeks    Status  New    Target Date  11/19/19      PT LONG TERM GOAL #5   Title  Patient will increase strength of RLE by at least 1/2 MMT grade in order to demonstrate improvement in strength and function.    Baseline  IE: 3+/5    Time  12    Period  Weeks    Status  New    Target Date  11/19/19      Additional Long Term Goals   Additional Long Term Goals  Yes      PT LONG TERM GOAL #6   Title  Patient will demonstrate bone density appropriate body mechanics for lifting, bending, and twisting activities to maintain bone health and decrease risk of fracture.    Baseline  IE: not demonstrated    Time  12    Period  Weeks    Status  New    Target Date  11/19/19            Plan - 09/03/19 1012    Clinical Impression Statement  Patient presents to clinic with excellent motivation to participate in therapy. Patient demonstrates deficits in SIJ motion B, pelvic posture, lumbar spine ROM, RLE strength, and balance. Patient able to achieve reduced L glute tension with manual and active interventions during today's session and responded positively to active interventions. Patient will benefit from continued skilled therapeutic intervention to address remaining deficits in SIJ motion B, pelvic posture, lumbar spine ROM, RLE strength, and balance in order to increase function and improve overall QOL.    Personal Factors and Comorbidities  Age;Fitness;Past/Current Experience;Behavior Pattern;Comorbidity 3+;Sex;Profession;Time since onset of injury/illness/exacerbation    Comorbidities  Arthritis, Hyperlipidemia, Hypertension    Examination-Activity Limitations  Bed  Mobility;Squat;Bend;Lift;Locomotion Level;Carry;Transfers;Sleep    Examination-Participation Restrictions  Church;Meal Prep;Cleaning;Community Activity;Valla Leaver [employer]    Stability/Clinical Decision Making  Evolving/Moderate complexity    Rehab Potential  Fair    PT Frequency  1x / week    PT Duration  12 weeks    PT Treatment/Interventions  ADLs/Self Care Home Management;Cryotherapy;Moist Heat;Iontophoresis 4mg /ml Dexamethasone;Electrical Stimulation;Neuromuscular re-education;Balance training;Therapeutic exercise;Therapeutic activities;Functional mobility training;Stair training;Gait training;Patient/family education;Manual techniques;Joint Manipulations;Spinal Manipulations;Taping;Scar mobilization;Dry needling;Passive range of motion;Energy conservation    PT Next Visit Plan  postural awareness and thoracic extension  PT Home Exercise Plan  Bridge with spinal articulation; heel lift graded activity    Consulted and Agree with Plan of Care  Patient       Patient will benefit from skilled therapeutic intervention in order to improve the following deficits and impairments:  Abnormal gait, Decreased balance, Decreased mobility, Difficulty walking, Pain, Postural dysfunction, Impaired flexibility, Decreased strength, Decreased activity tolerance, Decreased range of motion, Improper body mechanics, Decreased scar mobility, Hypomobility  Visit Diagnosis: Abnormal posture  Muscle weakness (generalized)  Chronic low back pain, unspecified back pain laterality, unspecified whether sciatica present     Problem List Patient Active Problem List   Diagnosis Date Noted  . 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 Chronic pain of right knee 08/11/2019  . LLQ pain 08/11/2019  . Overweight 08/11/2019  . Family history of breast cancer 08/11/2019  . Family history of colonic polyps 08/11/2019  . Routine general medical examination at a health care facility 03/24/2015  . Hypothyroidism  03/24/2015  . Thyroid nodule 03/24/2015  . Lichen sclerosus [id number]  . Benign essential HTN 10/04/2013  . HLD (hyperlipidemia) 10/04/2013   Myles Gip PT, DPT [phone] 09/03/2019, 1:12 PM  [facility] [address] [place], [place], [address] Phone: [phone]   Fax:  [phone]  Name: Mckenzie Willis MRN: [id number] Date of Birth: [birth year]

## 2019-09-10 ENCOUNTER — Encounter: Payer: Self-pay | Admitting: Physical Therapy

## 2019-09-10 ENCOUNTER — Ambulatory Visit: Payer: PPO | Admitting: Physical Therapy

## 2019-09-10 ENCOUNTER — Other Ambulatory Visit: Payer: Self-pay

## 2019-09-10 DIAGNOSIS — M6281 Muscle weakness (generalized): Secondary | ICD-10-CM

## 2019-09-10 DIAGNOSIS — R293 Abnormal posture: Secondary | ICD-10-CM | POA: Diagnosis not present

## 2019-09-10 DIAGNOSIS — G8929 Other chronic pain: Secondary | ICD-10-CM

## 2019-09-10 NOTE — Therapy (Signed)
[facility] [address] [place], [place], [address] Phone: [phone]   Fax:  [phone]  Physical Therapy Treatment  Patient Details  Name: Mckenzie Willis MRN: [id number] Date of Birth: 05-03-1951 Referring Provider (PT): Lavon Paganini   Encounter Date: 09/10/2019  PT End of Session - 09/10/19 1140    Visit Number  3    Number of Visits  13    Date for PT Re-Evaluation  11/19/19    PT Start Time  1000    PT Stop Time  1055    PT Time Calculation (min)  55 min    Activity Tolerance  Patient tolerated treatment well    Behavior During Therapy  [facility] for tasks assessed/performed       Past Medical History:  Diagnosis Date  . Arthritis   . History of chicken pox   . Hyperlipidemia   . Hypertension     Past Surgical History:  Procedure Laterality Date  . TOE SURGERY  2018   Dr Milinda Pointer  . TUBAL LIGATION      There were no vitals filed for this visit.  Subjective Assessment - 09/10/19 1003    Subjective  Patient notes that she has been doing the exercises in bed before getting up and notes they have been going well. She does note that there is some clunking/ when she takes the RLE out of knee to chest. Patient notes that up the steps she still feels a pull in the R side. Patient notes that she has been using the railing and being more intentional which she feels has helped.    Currently in Pain?  No/denies       TREATMENT  Pre-treatment assessment: L PSIS posterior minimally  Manual Therapy: STM and TPR performed to R gluteals and sacral ligaments to allow for decreased tension and pain and improved posture and function Sacral mobilizations for decreased spasm and improved mobility, grade II/III  Neuromuscular Re-education: Seated heel squeezes for improved force closure of SIJ Wide leg supine hooklying trunk rotations for improved relaxation and opening of the lower pelvis Supine hooklying, hip adduction-IR  isometrics for improved relaxation and opening of the lower pelvis Supine hip flexor stretch for improved posture and function Standing hip flexor stretch variations: level surface, with step, rear leg elevated for improved posture and function Seated glute sets for increased muscular control and force closure of SIJ   Patient educated throughout session on appropriate technique and form using multi-modal cueing, HEP, and activity modification. Patient articulated understanding and returned demonstration.  Patient Response to interventions: Patient notes she feels looser on L glute/sacral complex  ASSESSMENT Patient presents to clinic with excellent motivation to participate in therapy. Patient demonstrates deficits in SIJ motion B, pelvic posture, lumbar spine ROM, RLE strength, and balance. Patient able to achieve tolerable stretch of hip flexors in standing postures during today's session and responded positively to active interventions. Patient will benefit from continued skilled therapeutic intervention to address remaining deficits in SIJ motion B, pelvic posture, lumbar spine ROM, RLE strength, and balance in order to increase function and improve overall QOL.    PT Long Term Goals - 08/27/19 1327      PT LONG TERM GOAL #1   Title  Patient will be independent with HEP in order to improve strength and decrease back pain in order to improve pain-free function at home and work.    Baseline  IE: provided    Time  12    Period  Weeks    Status  New    Target Date  11/19/19      PT LONG TERM GOAL #2   Title  Patient will demonstrate improved function as evidenced by a score of 68 on FOTO measure for full participation in activities at home and in the community.    Baseline  IE: 25    Time  12    Period  Weeks    Status  New    Target Date  11/19/19      PT LONG TERM GOAL #3   Title  Patient will demonstrate ability to walk on an inclined surface with pain < 4/10 NPRS for duration  > 5 minutes in order to participate in preferred wellness activities.    Baseline  IE: unable    Time  12    Period  Weeks    Status  New    Target Date  11/19/19      PT LONG TERM GOAL #4   Title  Patient will decrease worst back pain as reported on NPRS by at least 2 points in order to demonstrate clinically significant reduction in back pain.    Baseline  IE: 9/10    Time  12    Period  Weeks    Status  New    Target Date  11/19/19      PT LONG TERM GOAL #5   Title  Patient will increase strength of RLE by at least 1/2 MMT grade in order to demonstrate improvement in strength and function.    Baseline  IE: 3+/5    Time  12    Period  Weeks    Status  New    Target Date  11/19/19      Additional Long Term Goals   Additional Long Term Goals  Yes      PT LONG TERM GOAL #6   Title  Patient will demonstrate bone density appropriate body mechanics for lifting, bending, and twisting activities to maintain bone health and decrease risk of fracture.    Baseline  IE: not demonstrated    Time  12    Period  Weeks    Status  New    Target Date  11/19/19            Plan - 09/10/19 1008    Clinical Impression Statement  Patient presents to clinic with excellent motivation to participate in therapy. Patient demonstrates deficits in SIJ motion B, pelvic posture, lumbar spine ROM, RLE strength, and balance. Patient able to achieve tolerable stretch of hip flexors in standing postures during today's session and responded positively to active interventions. Patient will benefit from continued skilled therapeutic intervention to address remaining deficits in SIJ motion B, pelvic posture, lumbar spine ROM, RLE strength, and balance in order to increase function and improve overall QOL.    Personal Factors and Comorbidities  Age;Fitness;Past/Current Experience;Behavior Pattern;Comorbidity 3+;Sex;Profession;Time since onset of injury/illness/exacerbation    Comorbidities  Arthritis,  Hyperlipidemia, Hypertension    Examination-Activity Limitations  Bed Mobility;Squat;Bend;Lift;Locomotion Level;Carry;Transfers;Sleep    Examination-Participation Restrictions  Church;Meal Prep;Cleaning;Community Activity;[employer]    Stability/Clinical Decision Making  Evolving/Moderate complexity    Rehab Potential  Fair    PT Frequency  1x / week    PT Duration  12 weeks    PT Treatment/Interventions  ADLs/Self Care Home Management;Cryotherapy;Moist Heat;Iontophoresis 4mg /ml Dexamethasone;Electrical Stimulation;Neuromuscular re-education;Balance training;Therapeutic exercise;Therapeutic activities;Functional mobility training;Stair training;Gait training;Patient/family education;Manual techniques;Joint Manipulations;Spinal Manipulations;Taping;Scar mobilization;Dry  needling;Passive range of motion;Energy conservation    PT Next Visit Plan  activity re-initiation    PT Home Exercise Plan  Bridge with spinal articulation; heel lift graded activity    Consulted and Agree with Plan of Care  Patient       Patient will benefit from skilled therapeutic intervention in order to improve the following deficits and impairments:  Abnormal gait, Decreased balance, Decreased mobility, Difficulty walking, Pain, Postural dysfunction, Impaired flexibility, Decreased strength, Decreased activity tolerance, Decreased range of motion, Improper body mechanics, Decreased scar mobility, Hypomobility  Visit Diagnosis: Abnormal posture  Muscle weakness (generalized)  Chronic low back pain, unspecified back pain laterality, unspecified whether sciatica present     Problem List Patient Active Problem List   Diagnosis Date Noted  . 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 Chronic pain of right knee 08/11/2019  . LLQ pain 08/11/2019  . Overweight 08/11/2019  . Family history of breast cancer 08/11/2019  . Family history of colonic polyps 08/11/2019  . Routine general medical examination at a health  care facility 03/24/2015  . Hypothyroidism 03/24/2015  . Thyroid nodule 03/24/2015  . Lichen sclerosus [id number]  . Benign essential HTN 10/04/2013  . HLD (hyperlipidemia) 10/04/2013   Myles Gip PT, DPT [phone] 09/10/2019, 11:40 AM  [facility] [address] [place], [place], [address] Phone: [phone]   Fax:  [phone]  Name: Mckenzie Willis MRN: [id number] Date of Birth: 1951/05/24

## 2019-09-11 DIAGNOSIS — E782 Mixed hyperlipidemia: Secondary | ICD-10-CM | POA: Diagnosis not present

## 2019-09-11 DIAGNOSIS — I10 Essential (primary) hypertension: Secondary | ICD-10-CM | POA: Diagnosis not present

## 2019-09-11 DIAGNOSIS — Z1159 Encounter for screening for other viral diseases: Secondary | ICD-10-CM | POA: Diagnosis not present

## 2019-09-12 ENCOUNTER — Telehealth: Payer: Self-pay

## 2019-09-12 LAB — COMPREHENSIVE METABOLIC PANEL
ALT: 10 IU/L (ref 0–32)
AST: 16 IU/L (ref 0–40)
Albumin/Globulin Ratio: 1.7 (ref 1.2–2.2)
Albumin: 4.5 g/dL (ref 3.8–4.8)
Alkaline Phosphatase: 71 IU/L (ref 39–117)
BUN/Creatinine Ratio: 23 (ref 12–28)
BUN: 16 mg/dL (ref 8–27)
Bilirubin Total: 0.6 mg/dL (ref 0.0–1.2)
CO2: 23 mmol/L (ref 20–29)
Calcium: 9.8 mg/dL (ref 8.7–10.3)
Chloride: 99 mmol/L (ref 96–106)
Creatinine, Ser: 0.71 mg/dL (ref 0.57–1.00)
GFR calc Af Amer: 102 mL/min/{1.73_m2} (ref >59)
GFR calc non Af Amer: 88 mL/min/{1.73_m2} (ref >59)
Globulin, Total: 2.6 g/dL (ref 1.5–4.5)
Glucose: 94 mg/dL (ref 65–99)
Potassium: 4.2 mmol/L (ref 3.5–5.2)
Sodium: 138 mmol/L (ref 134–144)
Total Protein: 7.1 g/dL (ref 6.0–8.5)

## 2019-09-12 LAB — LIPID PANEL
Chol/HDL Ratio: 2.3 ratio (ref 0.0–4.4)
Cholesterol, Total: 168 mg/dL (ref 100–199)
HDL: 73 mg/dL (ref >39)
LDL Chol Calc (NIH): 77 mg/dL (ref 0–99)
Triglycerides: 98 mg/dL (ref 0–149)
VLDL Cholesterol Cal: 18 mg/dL (ref 5–40)

## 2019-09-12 LAB — HEPATITIS C ANTIBODY: Hep C Virus Ab: <0.1 s/co ratio (ref 0.0–0.9)

## 2019-09-12 NOTE — Telephone Encounter (Signed)
-----   Message from Virginia Crews, MD sent at 09/12/2019  4:29 PM EST ----- Normal labs

## 2019-09-12 NOTE — Telephone Encounter (Signed)
Pt advised.   Thanks,   -Kassandra Meriweather  

## 2019-09-14 ENCOUNTER — Telehealth: Payer: Self-pay

## 2019-09-14 DIAGNOSIS — Z1211 Encounter for screening for malignant neoplasm of colon: Secondary | ICD-10-CM

## 2019-09-14 NOTE — Telephone Encounter (Signed)
Please advise 

## 2019-09-14 NOTE — Telephone Encounter (Signed)
Copied from [place]. Topic: General - Inquiry >> Sep 14, 2019 12:11 PM Richardo Priest, [place] wrote: Reason for CRM: Patient called in stating she would like to have her colonoscopy done at [facility] with Dr.Telito if possible. Patient would like to check with PCP if that's okay and if a referral can be sent over.

## 2019-09-14 NOTE — Telephone Encounter (Signed)
OK to place referral

## 2019-09-17 ENCOUNTER — Encounter: Payer: Self-pay | Admitting: Physical Therapy

## 2019-09-17 ENCOUNTER — Ambulatory Visit: Payer: PPO | Admitting: Physical Therapy

## 2019-09-17 ENCOUNTER — Other Ambulatory Visit: Payer: Self-pay

## 2019-09-17 DIAGNOSIS — R293 Abnormal posture: Secondary | ICD-10-CM | POA: Diagnosis not present

## 2019-09-17 DIAGNOSIS — M6281 Muscle weakness (generalized): Secondary | ICD-10-CM

## 2019-09-17 DIAGNOSIS — M545 Low back pain, unspecified: Secondary | ICD-10-CM

## 2019-09-17 DIAGNOSIS — G8929 Other chronic pain: Secondary | ICD-10-CM

## 2019-09-17 NOTE — Addendum Note (Signed)
Addended by: Shawna Orleans on: 09/17/2019 10:05 AM   Modules accepted: Orders

## 2019-09-17 NOTE — Therapy (Addendum)
[place] [address]. [place], [place], [address] Phone: [phone]   Fax:  [phone]  Physical Therapy Treatment  Patient Details  Name: Mckenzie Willis MRN: [id number] Date of Birth: 1950/12/17 Referring Provider (PT): Lavon Paganini   Encounter Date: 09/17/2019  PT End of Session - 09/17/19 1007    Visit Number  4    Number of Visits  13    Date for PT Re-Evaluation  11/19/19    PT Start Time  1000    PT Stop Time  1055    PT Time Calculation (min)  55 min    Activity Tolerance  Patient tolerated treatment well    Behavior During Therapy  [facility] for tasks assessed/performed       Past Medical History:  Diagnosis Date  . Arthritis   . History of chicken pox   . Hyperlipidemia   . Hypertension     Past Surgical History:  Procedure Laterality Date  . TOE SURGERY  2018   Dr Milinda Pointer  . TUBAL LIGATION      There were no vitals filed for this visit.  Subjective Assessment - 09/17/19 1003    Subjective  Patient notes that with her new exercises and last session she was a bit sore. She notes the rear foot elevated hip flexor stretch was aggravating to the joints in her foot so she didn't perform it as much.    Currently in Pain?  Yes    Pain Score  1        TREATMENT  Pre-treatment assessment: L PSIS posterior minimally, L lateral trunk flexion  Manual Therapy: STM and TPR performed to R gluteals and sacral ligaments to allow for decreased tension and pain and improved posture and function Sacral mobilizations for decreased spasm and improved mobility, grade II/III  Neuromuscular Re-education: Reviewed HEP. Supine hooklying diaphragmatic breathing with VCs and TCs for downregulation of the nervous system and improved management of IAP Supine hooklying, TrA activation with exhalation. VCs and TCs to decrease compensatory patterns and minimize aggravation of the lumbar paraspinals. Supine hooklying, TrA  activation with pelvic tilt and coordinated breath. VCs and TCs to decrease compensatory patterns and minimize aggravation of the lumbar paraspinals. Supine hooklying, R oblique reach. VCs and TCs to decrease compensatory patterns Supine R "snow angels" VCs and TCs to decrease compensatory patterns  Therapeutic Exercise: 6MWT (pre 1/10 pain, post 3/10 pain): 1502 feet. Patient had notable gait breakdowns beginning at about 300 feet with increased l lateral trunk flexion, decreased stance time on R, increased hip ER on R, and increased L pelvic rotation.    Patient educated throughout session on appropriate technique and form using multi-modal cueing, HEP, and activity modification. Patient articulated understanding and returned demonstration.  Patient Response to interventions: Patient notes 0/10 pain.  ASSESSMENT Patient presents to clinic with excellent motivation to participate in therapy. Patient demonstrates deficits in SIJ motion B, pelvic posture, lumbar spine ROM, RLE strength, and balance. Patient able to achieve good form on phase 1 abdominal strengthening during today's session and responded positively to active interventions. Patient will benefit from continued skilled therapeutic intervention to address remaining deficits in SIJ motion B, pelvic posture, lumbar spine ROM, RLE strength, and balance in order to increase function and improve overall QOL.    PT Long Term Goals - 08/27/19 1327      PT LONG TERM GOAL #1   Title  Patient will be independent with HEP in order  to improve strength and decrease back pain in order to improve pain-free function at home and work.    Baseline  IE: provided    Time  12    Period  Weeks    Status  New    Target Date  11/19/19      PT LONG TERM GOAL #2   Title  Patient will demonstrate improved function as evidenced by a score of 68 on FOTO measure for full participation in activities at home and in the community.    Baseline  IE: 38     Time  12    Period  Weeks    Status  New    Target Date  11/19/19      PT LONG TERM GOAL #3   Title  Patient will demonstrate ability to walk on an inclined surface with pain < 4/10 NPRS for duration > 5 minutes in order to participate in preferred wellness activities.    Baseline  IE: unable    Time  12    Period  Weeks    Status  New    Target Date  11/19/19      PT LONG TERM GOAL #4   Title  Patient will decrease worst back pain as reported on NPRS by at least 2 points in order to demonstrate clinically significant reduction in back pain.    Baseline  IE: 9/10    Time  12    Period  Weeks    Status  New    Target Date  11/19/19      PT LONG TERM GOAL #5   Title  Patient will increase strength of RLE by at least 1/2 MMT grade in order to demonstrate improvement in strength and function.    Baseline  IE: 3+/5    Time  12    Period  Weeks    Status  New    Target Date  11/19/19      Additional Long Term Goals   Additional Long Term Goals  Yes      PT LONG TERM GOAL #6   Title  Patient will demonstrate bone density appropriate body mechanics for lifting, bending, and twisting activities to maintain bone health and decrease risk of fracture.    Baseline  IE: not demonstrated    Time  12    Period  Weeks    Status  New    Target Date  11/19/19            Plan - 09/17/19 1009    Clinical Impression Statement  Patient presents to clinic with excellent motivation to participate in therapy. Patient demonstrates deficits in SIJ motion B, pelvic posture, lumbar spine ROM, RLE strength, and balance. Patient able to achieve good form on phase 1 abdominal strengthening during today's session and responded positively to active interventions. Patient will benefit from continued skilled therapeutic intervention to address remaining deficits in SIJ motion B, pelvic posture, lumbar spine ROM, RLE strength, and balance in order to increase function and improve overall QOL.    Personal  Factors and Comorbidities  Age;Fitness;Past/Current Experience;Behavior Pattern;Comorbidity 3+;Sex;Profession;Time since onset of injury/illness/exacerbation    Comorbidities  Arthritis, Hyperlipidemia, Hypertension    Examination-Activity Limitations  Bed Mobility;Squat;Bend;Lift;Locomotion Level;Carry;Transfers;Sleep    Examination-Participation Restrictions  Church;Meal Prep;Cleaning;Community Activity;[employer] Work;Laundry    Stability/Clinical Decision Making  Evolving/Moderate complexity    Rehab Potential  Fair    PT Frequency  1x / week    PT Duration  12  weeks    PT Treatment/Interventions  ADLs/Self Care Home Management;Cryotherapy;Moist Heat;Iontophoresis 4mg /ml Dexamethasone;Electrical Stimulation;Neuromuscular re-education;Balance training;Therapeutic exercise;Therapeutic activities;Functional mobility training;Stair training;Gait training;Patient/family education;Manual techniques;Joint Manipulations;Spinal Manipulations;Taping;Scar mobilization;Dry needling;Passive range of motion;Energy conservation    PT Next Visit Plan  activity re-initiation    PT Home Exercise Plan  Bridge with spinal articulation; heel lift graded activity    Consulted and Agree with Plan of Care  Patient       Patient will benefit from skilled therapeutic intervention in order to improve the following deficits and impairments:  Abnormal gait, Decreased balance, Decreased mobility, Difficulty walking, Pain, Postural dysfunction, Impaired flexibility, Decreased strength, Decreased activity tolerance, Decreased range of motion, Improper body mechanics, Decreased scar mobility, Hypomobility  Visit Diagnosis: Abnormal posture  Muscle weakness (generalized)  Chronic low back pain, unspecified back pain laterality, unspecified whether sciatica present     Problem List Patient Active Problem List   Diagnosis Date Noted  . 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 Chronic pain of right knee 08/11/2019  . LLQ  pain 08/11/2019  . Overweight 08/11/2019  . Family history of breast cancer 08/11/2019  . Family history of colonic polyps 08/11/2019  . Routine general medical examination at a health care facility 03/24/2015  . Hypothyroidism 03/24/2015  . Thyroid nodule 03/24/2015  . Lichen sclerosus [id number]  . Benign essential HTN 10/04/2013  . HLD (hyperlipidemia) 10/04/2013   Myles Gip PT, DPT [phone] 09/17/2019, 12:57 PM  [facility] [address] [place], [place], [address] Phone: [phone]   Fax:  [phone]  Name: Tashanda Brener MRN: [id number] Date of Birth: Dec 05, 1950

## 2019-09-24 ENCOUNTER — Encounter: Payer: Self-pay | Admitting: Physical Therapy

## 2019-09-24 ENCOUNTER — Other Ambulatory Visit: Payer: Self-pay

## 2019-09-24 ENCOUNTER — Ambulatory Visit: Payer: PPO | Attending: Family Medicine | Admitting: Physical Therapy

## 2019-09-24 DIAGNOSIS — M6281 Muscle weakness (generalized): Secondary | ICD-10-CM | POA: Diagnosis not present

## 2019-09-24 DIAGNOSIS — R293 Abnormal posture: Secondary | ICD-10-CM | POA: Insufficient documentation

## 2019-09-24 DIAGNOSIS — G8929 Other chronic pain: Secondary | ICD-10-CM

## 2019-09-24 DIAGNOSIS — M545 Low back pain, unspecified: Secondary | ICD-10-CM

## 2019-09-24 NOTE — Therapy (Signed)
[facility] [address] [place], [place], [address] Phone: [phone]   Fax:  [phone]  Physical Therapy Treatment  Patient Details  Name: Mckenzie Willis MRN: [id number] Date of Birth: January 12, 1951 Referring Provider (PT): Lavon Paganini   Encounter Date: 09/24/2019  PT End of Session - 09/24/19 1119    Visit Number  5    Number of Visits  13    Date for PT Re-Evaluation  11/19/19    PT Start Time  0955    PT Stop Time  1055    PT Time Calculation (min)  60 min    Activity Tolerance  Patient tolerated treatment well    Behavior During Therapy  [facility] for tasks assessed/performed       Past Medical History:  Diagnosis Date  . Arthritis   . History of chicken pox   . Hyperlipidemia   . Hypertension     Past Surgical History:  Procedure Laterality Date  . TOE SURGERY  2018   Dr Milinda Pointer  . TUBAL LIGATION      There were no vitals filed for this visit.  Subjective Assessment - 09/24/19 0956    Subjective  Patient reports that she had a very sore Thursday with no clear cause to the overall soreness. She states that she was sore all over. She notes that she has continued increased pain walking up stairs or an incline. She also reports some occasional difficulty sleeping. Patient notes pain is below a 1 today, but not quite a 0.    Currently in Pain?  No/denies        TREATMENT  Pre-treatment assessment: IC level  Neuromuscular Re-education: Patient education on sleep hygiene for improved pain modulation and recovery. Patient education on graded activity for increased load with low density to better manage pain. Seated diaphragmatic breathing with VC and TC PRN Seated TrA activation with exhalation. Patient requires VC and TC for sequencing and decreased compensatory pelvic movement. Standing TrA activation with exhalation. Patient requires VC and TC for sequencing and decreased compensatory pelvic movement. TrA  supported stair climb body mechanics for decreased low back pain with stair climbing.   Patient educated throughout session on appropriate technique and form using multi-modal cueing, HEP, and activity modification. Patient articulated understanding and returned demonstration.  Patient Response to interventions: Patient notes 0/10 pain.  ASSESSMENT Patient presents to clinic with excellent motivation to participate in therapy. Patient demonstrates deficits in SIJ motion B, pelvic posture, lumbar spine ROM, RLE strength, and balance. Patient engaged in collaborative planning of home exercise program including daily postural re-education and graded activity during today's session and responded positively to educational interventions. Patient will benefit from continued skilled therapeutic intervention to address remaining deficits in SIJ motion B, pelvic posture, lumbar spine ROM, RLE strength, and balance in order to increase function and improve overall QOL.   PT Long Term Goals - 08/27/19 1327      PT LONG TERM GOAL #1   Title  Patient will be independent with HEP in order to improve strength and decrease back pain in order to improve pain-free function at home and work.    Baseline  IE: provided    Time  12    Period  Weeks    Status  New    Target Date  11/19/19      PT LONG TERM GOAL #2   Title  Patient will demonstrate improved function as evidenced by a score of  68 on FOTO measure for full participation in activities at home and in the community.    Baseline  IE: 107    Time  12    Period  Weeks    Status  New    Target Date  11/19/19      PT LONG TERM GOAL #3   Title  Patient will demonstrate ability to walk on an inclined surface with pain < 4/10 NPRS for duration > 5 minutes in order to participate in preferred wellness activities.    Baseline  IE: unable    Time  12    Period  Weeks    Status  New    Target Date  11/19/19      PT LONG TERM GOAL #4   Title  Patient will  decrease worst back pain as reported on NPRS by at least 2 points in order to demonstrate clinically significant reduction in back pain.    Baseline  IE: 9/10    Time  12    Period  Weeks    Status  New    Target Date  11/19/19      PT LONG TERM GOAL #5   Title  Patient will increase strength of RLE by at least 1/2 MMT grade in order to demonstrate improvement in strength and function.    Baseline  IE: 3+/5    Time  12    Period  Weeks    Status  New    Target Date  11/19/19      Additional Long Term Goals   Additional Long Term Goals  Yes      PT LONG TERM GOAL #6   Title  Patient will demonstrate bone density appropriate body mechanics for lifting, bending, and twisting activities to maintain bone health and decrease risk of fracture.    Baseline  IE: not demonstrated    Time  12    Period  Weeks    Status  New    Target Date  11/19/19            Plan - 09/24/19 1120    Clinical Impression Statement  Patient presents to clinic with excellent motivation to participate in therapy. Patient demonstrates deficits in SIJ motion B, pelvic posture, lumbar spine ROM, RLE strength, and balance. Patient engaged in collaborative planning of home exercise program including daily postural re-education and graded activity during today's session and responded positively to educational interventions. Patient will benefit from continued skilled therapeutic intervention to address remaining deficits in SIJ motion B, pelvic posture, lumbar spine ROM, RLE strength, and balance in order to increase function and improve overall QOL.    Personal Factors and Comorbidities  Age;Fitness;Past/Current Experience;Behavior Pattern;Comorbidity 3+;Sex;Profession;Time since onset of injury/illness/exacerbation    Comorbidities  Arthritis, Hyperlipidemia, Hypertension    Examination-Activity Limitations  Bed Mobility;Squat;Bend;Lift;Locomotion Level;Carry;Transfers;Sleep    Examination-Participation  Restrictions  Church;Meal Prep;Cleaning;Community Activity;[employer]    Stability/Clinical Decision Making  Evolving/Moderate complexity    Rehab Potential  Fair    PT Frequency  1x / week    PT Duration  12 weeks    PT Treatment/Interventions  ADLs/Self Care Home Management;Cryotherapy;Moist Heat;Iontophoresis 4mg /ml Dexamethasone;Electrical Stimulation;Neuromuscular re-education;Balance training;Therapeutic exercise;Therapeutic activities;Functional mobility training;Stair training;Gait training;Patient/family education;Manual techniques;Joint Manipulations;Spinal Manipulations;Taping;Scar mobilization;Dry needling;Passive range of motion;Energy conservation    PT Next Visit Plan  activity re-initiation    PT Home Exercise Plan  Bridge with spinal articulation; heel lift graded activity    Consulted and Agree with Plan of Care  Patient       Patient will benefit from skilled therapeutic intervention in order to improve the following deficits and impairments:  Abnormal gait, Decreased balance, Decreased mobility, Difficulty walking, Pain, Postural dysfunction, Impaired flexibility, Decreased strength, Decreased activity tolerance, Decreased range of motion, Improper body mechanics, Decreased scar mobility, Hypomobility  Visit Diagnosis: Abnormal posture  Muscle weakness (generalized)  Chronic low back pain, unspecified back pain laterality, unspecified whether sciatica present     Problem List Patient Active Problem List   Diagnosis Date Noted  . 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 Chronic pain of right knee 08/11/2019  . LLQ pain 08/11/2019  . Overweight 08/11/2019  . Family history of breast cancer 08/11/2019  . Family history of colonic polyps 08/11/2019  . Routine general medical examination at a health care facility 03/24/2015  . Hypothyroidism 03/24/2015  . Thyroid nodule 03/24/2015  . Lichen sclerosus [id number]  . Benign essential HTN 10/04/2013  . HLD  (hyperlipidemia) 10/04/2013    Myles Gip PT, DPT [phone] 09/24/2019, 11:26 AM  [facility] [address] [place], [place], [address] Phone: [phone]   Fax:  [phone]  Name: Mckenzie Willis MRN: [id number] Date of Birth: Nov 21, 1950

## 2019-09-27 ENCOUNTER — Other Ambulatory Visit: Payer: Self-pay | Admitting: Family Medicine

## 2019-09-27 MED ORDER — LOSARTAN POTASSIUM-HCTZ 100-25 MG PO TABS: 1.0000 | ORAL_TABLET | Freq: Every day | ORAL | 1 refills | Status: DC

## 2019-09-27 NOTE — Telephone Encounter (Signed)
Pt is calling in to request a refill for losartan-hydrochlorothiazide (HYZAAR) 100-25 MG tablet     Pharmacy:  [id number] - [place], [place] [address] Phone:  [phone]  Fax:  [phone]

## 2019-09-27 NOTE — Telephone Encounter (Signed)
Refill request for losartan-hydrochlorothiazide (HYZAAR) 100-25 MG tablet. Last prescribed by Mable Paris, FNP at [facility].  Prescription expired on 10/07/2017. New pt to [facility]

## 2019-10-01 ENCOUNTER — Ambulatory Visit: Payer: PPO | Admitting: Physical Therapy

## 2019-10-01 ENCOUNTER — Other Ambulatory Visit: Payer: Self-pay

## 2019-10-01 ENCOUNTER — Encounter: Payer: Self-pay | Admitting: Physical Therapy

## 2019-10-01 DIAGNOSIS — G8929 Other chronic pain: Secondary | ICD-10-CM

## 2019-10-01 DIAGNOSIS — M6281 Muscle weakness (generalized): Secondary | ICD-10-CM

## 2019-10-01 DIAGNOSIS — R293 Abnormal posture: Secondary | ICD-10-CM | POA: Diagnosis not present

## 2019-10-01 NOTE — Therapy (Signed)
[facility] [address] [place], [place], [address] Phone: [phone]   Fax:  [phone]  Physical Therapy Treatment  Patient Details  Name: Mckenzie Willis MRN: [id number] Date of Birth: [birth year] Referring Provider (PT): Lavon Paganini   Encounter Date: 10/01/2019  PT End of Session - 10/01/19 1208    Visit Number  6    Number of Visits  13    Date for PT Re-Evaluation  11/19/19    PT Start Time  1003    PT Stop Time  1100    PT Time Calculation (min)  57 min    Activity Tolerance  Patient tolerated treatment well    Behavior During Therapy  [facility] for tasks assessed/performed       Past Medical History:  Diagnosis Date  . Arthritis   . History of chicken pox   . Hyperlipidemia   . Hypertension     Past Surgical History:  Procedure Laterality Date  . TOE SURGERY  2018   Dr Milinda Pointer  . TUBAL LIGATION      There were no vitals filed for this visit.  Subjective Assessment - 10/01/19 1004    Subjective  Patient notes that she did a lot of yard cleaning last Thursday and her back pain was aggravated. Patient notes that she did work in the yard on Saturday without issue and maybe with some pacing of activity. Patient notes that she walked yesterday evening and had pain up the incline which did hurt, but she notes not as bad.    Currently in Pain?  No/denies      TREATMENT  Pre-treatment assessment: IC level  Manual Interventions: STM and TPR performed to R hip and thigh complex to allow for decreased tension and pain and improved posture and function with vibratory and percussive instrument  Neuromuscular Re-education: Patient education on body mechanics including seated and standing hip hinge. Seated hip hinge with dowel for improved postural awareness and strength.  Standing hip hinge with dowel for improved postural awareness and strength.   Patient educated throughout session on appropriate technique and  form using multi-modal cueing, HEP, and activity modification. Patient articulated understanding and returned demonstration.  Patient Response to interventions: Patient notes "I feel like I had a workout"  ASSESSMENT Patient presents to clinic with excellent motivation to participate in therapy. Patient demonstrates deficits in SIJ motion B, pelvic posture, lumbar spine ROM, RLE strength, and balance. Patient able to coordinate and control hip hinge with neutral spinal alignment during today's session and responded positively to educational interventions. Patient will benefit from continued skilled therapeutic intervention to address remaining deficits in SIJ motion B, pelvic posture, lumbar spine ROM, RLE strength, and balance in order to increase function and improve overall QOL.    PT Long Term Goals - 08/27/19 1327      PT LONG TERM GOAL #1   Title  Patient will be independent with HEP in order to improve strength and decrease back pain in order to improve pain-free function at home and work.    Baseline  IE: provided    Time  12    Period  Weeks    Status  New    Target Date  11/19/19      PT LONG TERM GOAL #2   Title  Patient will demonstrate improved function as evidenced by a score of 68 on FOTO measure for full participation in activities at home and in the community.  Baseline  IE: 52    Time  12    Period  Weeks    Status  New    Target Date  11/19/19      PT LONG TERM GOAL #3   Title  Patient will demonstrate ability to walk on an inclined surface with pain < 4/10 NPRS for duration > 5 minutes in order to participate in preferred wellness activities.    Baseline  IE: unable    Time  12    Period  Weeks    Status  New    Target Date  11/19/19      PT LONG TERM GOAL #4   Title  Patient will decrease worst back pain as reported on NPRS by at least 2 points in order to demonstrate clinically significant reduction in back pain.    Baseline  IE: 9/10    Time  12     Period  Weeks    Status  New    Target Date  11/19/19      PT LONG TERM GOAL #5   Title  Patient will increase strength of RLE by at least 1/2 MMT grade in order to demonstrate improvement in strength and function.    Baseline  IE: 3+/5    Time  12    Period  Weeks    Status  New    Target Date  11/19/19      Additional Long Term Goals   Additional Long Term Goals  Yes      PT LONG TERM GOAL #6   Title  Patient will demonstrate bone density appropriate body mechanics for lifting, bending, and twisting activities to maintain bone health and decrease risk of fracture.    Baseline  IE: not demonstrated    Time  12    Period  Weeks    Status  New    Target Date  11/19/19            Plan - 10/01/19 1209    Clinical Impression Statement  Patient presents to clinic with excellent motivation to participate in therapy. Patient demonstrates deficits in SIJ motion B, pelvic posture, lumbar spine ROM, RLE strength, and balance. Patient able to coordinate and control hip hinge with neutral spinal alignment during today's session and responded positively to educational interventions. Patient will benefit from continued skilled therapeutic intervention to address remaining deficits in SIJ motion B, pelvic posture, lumbar spine ROM, RLE strength, and balance in order to increase function and improve overall QOL.    Personal Factors and Comorbidities  Age;Fitness;Past/Current Experience;Behavior Pattern;Comorbidity 3+;Sex;Profession;Time since onset of injury/illness/exacerbation    Comorbidities  Arthritis, Hyperlipidemia, Hypertension    Examination-Activity Limitations  Bed Mobility;Squat;Bend;Lift;Locomotion Level;Carry;Transfers;Sleep    Examination-Participation Restrictions  Church;Meal Prep;Cleaning;Community Activity;[employer]    Stability/Clinical Decision Making  Evolving/Moderate complexity    Rehab Potential  Fair    PT Frequency  1x / week    PT Duration  12 weeks    PT  Treatment/Interventions  ADLs/Self Care Home Management;Cryotherapy;Moist Heat;Iontophoresis 4mg /ml Dexamethasone;Electrical Stimulation;Neuromuscular re-education;Balance training;Therapeutic exercise;Therapeutic activities;Functional mobility training;Stair training;Gait training;Patient/family education;Manual techniques;Joint Manipulations;Spinal Manipulations;Taping;Scar mobilization;Dry needling;Passive range of motion;Energy conservation    PT Next Visit Plan  body mechanics progression    PT Home Exercise Plan  Bridge with spinal articulation; heel lift graded activity    Consulted and Agree with Plan of Care  Patient       Patient will benefit from skilled therapeutic intervention in order to improve the following  deficits and impairments:  Abnormal gait, Decreased balance, Decreased mobility, Difficulty walking, Pain, Postural dysfunction, Impaired flexibility, Decreased strength, Decreased activity tolerance, Decreased range of motion, Improper body mechanics, Decreased scar mobility, Hypomobility  Visit Diagnosis: Abnormal posture  Muscle weakness (generalized)  Chronic low back pain, unspecified back pain laterality, unspecified whether sciatica present     Problem List Patient Active Problem List   Diagnosis Date Noted  . 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 Chronic pain of right knee 08/11/2019  . LLQ pain 08/11/2019  . Overweight 08/11/2019  . Family history of breast cancer 08/11/2019  . Family history of colonic polyps 08/11/2019  . Routine general medical examination at a health care facility 03/24/2015  . Hypothyroidism 03/24/2015  . Thyroid nodule 03/24/2015  . Lichen sclerosus [id number]  . Benign essential HTN 10/04/2013  . HLD (hyperlipidemia) 10/04/2013   Myles Gip PT, DPT [phone] 10/01/2019, 12:18 PM  [place] [facility] [address] [place], [place], [address] Phone: [phone]   Fax:   [phone]  Name: Neyra Hajdu MRN: [id number] Date of Birth: 1951-02-27

## 2019-10-08 ENCOUNTER — Encounter: Payer: Self-pay | Admitting: Physical Therapy

## 2019-10-08 ENCOUNTER — Ambulatory Visit: Payer: PPO | Admitting: Physical Therapy

## 2019-10-08 ENCOUNTER — Other Ambulatory Visit: Payer: Self-pay

## 2019-10-08 DIAGNOSIS — R293 Abnormal posture: Secondary | ICD-10-CM

## 2019-10-08 DIAGNOSIS — M545 Low back pain, unspecified: Secondary | ICD-10-CM

## 2019-10-08 DIAGNOSIS — G8929 Other chronic pain: Secondary | ICD-10-CM

## 2019-10-08 DIAGNOSIS — M6281 Muscle weakness (generalized): Secondary | ICD-10-CM

## 2019-10-08 NOTE — Therapy (Signed)
[facility] [address] [place], [place], [address] Phone: [phone]   Fax:  [phone]  Physical Therapy Treatment  Patient Details  Name: Mckenzie Willis MRN: [id number] Date of Birth: [birth year] Referring Provider (PT): Lavon Paganini   Encounter Date: 10/08/2019  PT End of Session - 10/08/19 1200    Visit Number  7    Number of Visits  13    Date for PT Re-Evaluation  11/19/19    PT Start Time  0950    PT Stop Time  1050    PT Time Calculation (min)  60 min    Activity Tolerance  Patient tolerated treatment well    Behavior During Therapy  [facility] for tasks assessed/performed       Past Medical History:  Diagnosis Date  . Arthritis   . History of chicken pox   . Hyperlipidemia   . Hypertension     Past Surgical History:  Procedure Laterality Date  . TOE SURGERY  2018   Dr Milinda Pointer  . TUBAL LIGATION      There were no vitals filed for this visit.  Subjective Assessment - 10/08/19 0953    Subjective  Patient notes that her week was taken up by some family needs/emergencies. Patient notes that she started to focus on how her posture was during yard work. She notes that she used varied postures and used kneeling, and sitting as well. Patient notes that she is sore but it is the expected sore from a full day of yardwork.    Currently in Pain?  Yes    Pain Score  3     Pain Location  Back    Pain Orientation  Lower    Pain Descriptors / Indicators  Sore       TREATMENT  Pre-treatment assessment: IC level, no apparent rotational of pelvis  Manual Interventions: STM and TPR performed to B gluteal and lumbar regions to allow for decreased tension and pain and improved posture and function with vibratory and percussive instrument  Neuromuscular Re-education: Prone TrA activation with modified press-up for improved postural control Wall thoracic extension/swan with coordinated breath and TrA activation for  improved postural control Seated hip hinge with dowel for improved postural awareness and strength.  Standing hip hinge with dowel for improved postural awareness and strength. Seated thoracic extension over ball for improved postural awareness and control Standing Pilates Postural Control with coordinated breath and VCs for decreased compensatory patterns Hug a Tree, G/BTB Serve a Tray, G/BTB Reverse Hug a Tree, G/BTB Reverse Serve a Tray, G/BTB   Patient educated throughout session on appropriate technique and form using multi-modal cueing, HEP, and activity modification. Patient articulated understanding and returned demonstration.  Patient Response to interventions: Patient notes "I like the bands"  ASSESSMENT Patient presents to clinic with excellent motivation to participate in therapy. Patient demonstrates deficits in SIJ motion B, pelvic posture, lumbar spine ROM, RLE strength, and balance. Patient able to coordinate and control posture with increased resistive activities during today's session and responded positively to educational interventions. Patient will benefit from continued skilled therapeutic intervention to address remaining deficits in SIJ motion B, pelvic posture, lumbar spine ROM, RLE strength, and balance in order to increase function and improve overall QOL.    PT Long Term Goals - 08/27/19 1327      PT LONG TERM GOAL #1   Title  Patient will be independent with HEP in order to improve strength and decrease  back pain in order to improve pain-free function at home and work.    Baseline  IE: provided    Time  12    Period  Weeks    Status  New    Target Date  11/19/19      PT LONG TERM GOAL #2   Title  Patient will demonstrate improved function as evidenced by a score of 68 on FOTO measure for full participation in activities at home and in the community.    Baseline  IE: 29    Time  12    Period  Weeks    Status  New    Target Date  11/19/19      PT LONG  TERM GOAL #3   Title  Patient will demonstrate ability to walk on an inclined surface with pain < 4/10 NPRS for duration > 5 minutes in order to participate in preferred wellness activities.    Baseline  IE: unable    Time  12    Period  Weeks    Status  New    Target Date  11/19/19      PT LONG TERM GOAL #4   Title  Patient will decrease worst back pain as reported on NPRS by at least 2 points in order to demonstrate clinically significant reduction in back pain.    Baseline  IE: 9/10    Time  12    Period  Weeks    Status  New    Target Date  11/19/19      PT LONG TERM GOAL #5   Title  Patient will increase strength of RLE by at least 1/2 MMT grade in order to demonstrate improvement in strength and function.    Baseline  IE: 3+/5    Time  12    Period  Weeks    Status  New    Target Date  11/19/19      Additional Long Term Goals   Additional Long Term Goals  Yes      PT LONG TERM GOAL #6   Title  Patient will demonstrate bone density appropriate body mechanics for lifting, bending, and twisting activities to maintain bone health and decrease risk of fracture.    Baseline  IE: not demonstrated    Time  12    Period  Weeks    Status  New    Target Date  11/19/19            Plan - 10/08/19 1200    Clinical Impression Statement  Patient presents to clinic with excellent motivation to participate in therapy. Patient demonstrates deficits in SIJ motion B, pelvic posture, lumbar spine ROM, RLE strength, and balance. Patient able to coordinate and control posture with increased resistive activities during today's session and responded positively to educational interventions. Patient will benefit from continued skilled therapeutic intervention to address remaining deficits in SIJ motion B, pelvic posture, lumbar spine ROM, RLE strength, and balance in order to increase function and improve overall QOL.    Personal Factors and Comorbidities  Age;Fitness;Past/Current  Experience;Behavior Pattern;Comorbidity 3+;Sex;Profession;Time since onset of injury/illness/exacerbation    Comorbidities  Arthritis, Hyperlipidemia, Hypertension    Examination-Activity Limitations  Bed Mobility;Squat;Bend;Lift;Locomotion Level;Carry;Transfers;Sleep    Examination-Participation Restrictions  Church;Meal Prep;Cleaning;Community Activity;Yard Work;Laundry    Stability/Clinical Decision Making  Evolving/Moderate complexity    Rehab Potential  Fair    PT Frequency  1x / week    PT Duration  12 weeks    PT  Treatment/Interventions  ADLs/Self Care Home Management;Cryotherapy;Moist Heat;Iontophoresis 4mg /ml Dexamethasone;Electrical Stimulation;Neuromuscular re-education;Balance training;Therapeutic exercise;Therapeutic activities;Functional mobility training;Stair training;Gait training;Patient/family education;Manual techniques;Joint Manipulations;Spinal Manipulations;Taping;Scar mobilization;Dry needling;Passive range of motion;Energy conservation    PT Next Visit Plan  body mechanics progression    PT Home Exercise Plan  Bridge with spinal articulation; heel lift graded activity    Consulted and Agree with Plan of Care  Patient       Patient will benefit from skilled therapeutic intervention in order to improve the following deficits and impairments:  Abnormal gait, Decreased balance, Decreased mobility, Difficulty walking, Pain, Postural dysfunction, Impaired flexibility, Decreased strength, Decreased activity tolerance, Decreased range of motion, Improper body mechanics, Decreased scar mobility, Hypomobility  Visit Diagnosis: Abnormal posture  Muscle weakness (generalized)  Chronic low back pain, unspecified back pain laterality, unspecified whether sciatica present     Problem List Patient Active Problem List   Diagnosis Date Noted  . 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 Chronic pain of right knee 08/11/2019  . LLQ pain 08/11/2019  . Overweight 08/11/2019  .  Family history of breast cancer 08/11/2019  . Family history of colonic polyps 08/11/2019  . Routine general medical examination at a health care facility 03/24/2015  . Hypothyroidism 03/24/2015  . Thyroid nodule 03/24/2015  . Lichen sclerosus [id number]  . Benign essential HTN 10/04/2013  . HLD (hyperlipidemia) 10/04/2013   Myles Gip PT, DPT [phone] 10/08/2019, 12:08 PM  [facility] [address] [place], [place], [address] Phone: [phone]   Fax:  [phone]  Name: Mlynn Weinkauf MRN: [id number] Date of Birth: 12-30-1950

## 2019-10-15 ENCOUNTER — Encounter: Payer: Self-pay | Admitting: Physical Therapy

## 2019-10-23 ENCOUNTER — Ambulatory Visit: Payer: PPO | Admitting: Physical Therapy

## 2019-10-30 DIAGNOSIS — Z8371 Family history of colonic polyps: Secondary | ICD-10-CM | POA: Diagnosis not present

## 2019-10-30 DIAGNOSIS — K648 Other hemorrhoids: Secondary | ICD-10-CM | POA: Diagnosis not present

## 2019-11-05 ENCOUNTER — Other Ambulatory Visit: Payer: Self-pay

## 2019-11-05 ENCOUNTER — Ambulatory Visit: Payer: PPO | Attending: Family Medicine | Admitting: Physical Therapy

## 2019-11-05 ENCOUNTER — Encounter: Payer: Self-pay | Admitting: Physical Therapy

## 2019-11-05 DIAGNOSIS — R293 Abnormal posture: Secondary | ICD-10-CM | POA: Insufficient documentation

## 2019-11-05 DIAGNOSIS — M545 Low back pain, unspecified: Secondary | ICD-10-CM

## 2019-11-05 DIAGNOSIS — G8929 Other chronic pain: Secondary | ICD-10-CM | POA: Insufficient documentation

## 2019-11-05 DIAGNOSIS — M6281 Muscle weakness (generalized): Secondary | ICD-10-CM | POA: Diagnosis not present

## 2019-11-05 NOTE — Therapy (Signed)
[facility] [address] [place], [place], [address] Phone: [phone]   Fax:  [phone]  Physical Therapy Treatment  Patient Details  Name: Mckenzie Willis MRN: [id number] Date of Birth: 1951-07-25 Referring Provider (PT): Lavon Paganini   Encounter Date: 11/05/2019  PT End of Session - 11/05/19 1104    Visit Number  8    Number of Visits  13    Date for PT Re-Evaluation  11/19/19    PT Start Time  0955    PT Stop Time  1052    PT Time Calculation (min)  57 min    Activity Tolerance  Patient tolerated treatment well    Behavior During Therapy  [facility] for tasks assessed/performed       Past Medical History:  Diagnosis Date  . Arthritis   . History of chicken pox   . Hyperlipidemia   . Hypertension     Past Surgical History:  Procedure Laterality Date  . TOE SURGERY  2018   Dr Milinda Pointer  . TUBAL LIGATION      There were no vitals filed for this visit.  Subjective Assessment - 11/05/19 1007    Subjective  Patient notes that she continues to have pain during activity. However, she has been able to participate in gardening more and has even been able to walk up an incline at a slow pace. She continues to have pain with up/down stairs at home.    Currently in Pain?  No/denies       TREATMENT  Pre-treatment assessment: IC level, mild R pelvic obliquity  Neuromuscular Re-education: Graded activity: 10 min walking on treadmill at 1.0 speed, with incline 0.0-2.5% for improved tolerance to pain provoking activity (1/10 pain at end, symmetrical) Standing Pilates Postural Control with coordinated breath and VCs for decreased compensatory patterns Serve a Tray, GTB Seated figure 4 stretch for pain modulation and improved joint posture at hip/SIJ Seated side stretch for pain modulation and improved joint posture at hip/SIJ Standing hip abduction for improved pelvic control in standing and hip strength for gait landing  mechanics Patient education on healing process and strategies for graded activity to promote overall functional improvement.   Patient educated throughout session on appropriate technique and form using multi-modal cueing, HEP, and activity modification. Patient articulated understanding and returned demonstration.  Patient Response to interventions: Denies any significant increased pain  ASSESSMENT Patient presents to clinic with excellent motivation to participate in therapy. Patient demonstrates deficits in SIJ motion B, pelvic posture, lumbar spine ROM, RLE strength, and balance. Patient able to tolerate walking at 2.5% incline at 1.0 speed with no significant increase in R SIJ/hip pain during today's session and responded positively to educational interventions. Patient will benefit from continued skilled therapeutic intervention to address remaining deficits in SIJ motion B, pelvic posture, lumbar spine ROM, RLE strength, and balance in order to increase function and improve overall QOL.    PT Long Term Goals - 08/27/19 1327      PT LONG TERM GOAL #1   Title  Patient will be independent with HEP in order to improve strength and decrease back pain in order to improve pain-free function at home and work.    Baseline  IE: provided    Time  12    Period  Weeks    Status  New    Target Date  11/19/19      PT LONG TERM GOAL #2   Title  Patient will  demonstrate improved function as evidenced by a score of 68 on FOTO measure for full participation in activities at home and in the community.    Baseline  IE: 30    Time  12    Period  Weeks    Status  New    Target Date  11/19/19      PT LONG TERM GOAL #3   Title  Patient will demonstrate ability to walk on an inclined surface with pain < 4/10 NPRS for duration > 5 minutes in order to participate in preferred wellness activities.    Baseline  IE: unable    Time  12    Period  Weeks    Status  New    Target Date  11/19/19      PT  LONG TERM GOAL #4   Title  Patient will decrease worst back pain as reported on NPRS by at least 2 points in order to demonstrate clinically significant reduction in back pain.    Baseline  IE: 9/10    Time  12    Period  Weeks    Status  New    Target Date  11/19/19      PT LONG TERM GOAL #5   Title  Patient will increase strength of RLE by at least 1/2 MMT grade in order to demonstrate improvement in strength and function.    Baseline  IE: 3+/5    Time  12    Period  Weeks    Status  New    Target Date  11/19/19      Additional Long Term Goals   Additional Long Term Goals  Yes      PT LONG TERM GOAL #6   Title  Patient will demonstrate bone density appropriate body mechanics for lifting, bending, and twisting activities to maintain bone health and decrease risk of fracture.    Baseline  IE: not demonstrated    Time  12    Period  Weeks    Status  New    Target Date  11/19/19            Plan - 11/05/19 1104    Clinical Impression Statement  Patient presents to clinic with excellent motivation to participate in therapy. Patient demonstrates deficits in SIJ motion B, pelvic posture, lumbar spine ROM, RLE strength, and balance. Patient able to tolerate walking at 2.5% incline at 1.0 speed with no significant increase in R SIJ/hip pain during today's session and responded positively to educational interventions. Patient will benefit from continued skilled therapeutic intervention to address remaining deficits in SIJ motion B, pelvic posture, lumbar spine ROM, RLE strength, and balance in order to increase function and improve overall QOL.    Personal Factors and Comorbidities  Age;Fitness;Past/Current Experience;Behavior Pattern;Comorbidity 3+;Sex;Profession;Time since onset of injury/illness/exacerbation    Comorbidities  Arthritis, Hyperlipidemia, Hypertension    Examination-Activity Limitations  Bed Mobility;Squat;Bend;Lift;Locomotion Level;Carry;Transfers;Sleep     Examination-Participation Restrictions  Church;Meal Prep;Cleaning;Community Activity;[employer]    Stability/Clinical Decision Making  Evolving/Moderate complexity    Rehab Potential  Fair    PT Frequency  1x / week    PT Duration  12 weeks    PT Treatment/Interventions  ADLs/Self Care Home Management;Cryotherapy;Moist Heat;Iontophoresis 4mg /ml Dexamethasone;Electrical Stimulation;Neuromuscular re-education;Balance training;Therapeutic exercise;Therapeutic activities;Functional mobility training;Stair training;Gait training;Patient/family education;Manual techniques;Joint Manipulations;Spinal Manipulations;Taping;Scar mobilization;Dry needling;Passive range of motion;Energy conservation    PT Next Visit Plan  body mechanics progression    PT Home Exercise Plan  Bridge with spinal articulation; heel  lift graded activity    Consulted and Agree with Plan of Care  Patient       Patient will benefit from skilled therapeutic intervention in order to improve the following deficits and impairments:  Abnormal gait, Decreased balance, Decreased mobility, Difficulty walking, Pain, Postural dysfunction, Impaired flexibility, Decreased strength, Decreased activity tolerance, Decreased range of motion, Improper body mechanics, Decreased scar mobility, Hypomobility  Visit Diagnosis: Abnormal posture  Muscle weakness (generalized)  Chronic low back pain, unspecified back pain laterality, unspecified whether sciatica present     Problem List Patient Active Problem List   Diagnosis Date Noted  . 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 Chronic pain of right knee 08/11/2019  . LLQ pain 08/11/2019  . Overweight 08/11/2019  . Family history of breast cancer 08/11/2019  . Family history of colonic polyps 08/11/2019  . Routine general medical examination at a health care facility 03/24/2015  . Hypothyroidism 03/24/2015  . Thyroid nodule 03/24/2015  . Lichen sclerosus [id number]  . Benign  essential HTN 10/04/2013  . HLD (hyperlipidemia) 10/04/2013   Myles Gip PT, DPT [phone] 11/05/2019, 11:35 AM  [facility] [address] [place], [place], [address] Phone: [phone]   Fax:  [phone]  Name: Mckenzie Willis MRN: [id number] Date of Birth: 07/15/1951

## 2019-11-08 ENCOUNTER — Encounter: Payer: Self-pay | Admitting: Family Medicine

## 2019-11-08 ENCOUNTER — Other Ambulatory Visit: Payer: Self-pay

## 2019-11-08 ENCOUNTER — Ambulatory Visit (INDEPENDENT_AMBULATORY_CARE_PROVIDER_SITE_OTHER): Payer: PPO | Admitting: Family Medicine

## 2019-11-08 VITALS — BP 137/70 | HR 66 | Temp 96.6°F | Ht 65.0 in | Wt 159.0 lb

## 2019-11-08 DIAGNOSIS — Z0000 Encounter for general adult medical examination without abnormal findings: Secondary | ICD-10-CM | POA: Diagnosis not present

## 2019-11-08 DIAGNOSIS — Z1231 Encounter for screening mammogram for malignant neoplasm of breast: Secondary | ICD-10-CM | POA: Diagnosis not present

## 2019-11-08 DIAGNOSIS — Z780 Asymptomatic menopausal state: Secondary | ICD-10-CM

## 2019-11-08 MED ORDER — ROSUVASTATIN CALCIUM 10 MG PO TABS: 10.0000 mg | ORAL_TABLET | Freq: Every day | ORAL | 1 refills | Status: DC

## 2019-11-08 NOTE — Patient Instructions (Addendum)
The CDC recommends two doses of Shingrix (the shingles vaccine) separated by 2 to 6 months for adults age 67 years and older. I recommend checking with your insurance plan regarding coverage for this vaccine.     Preventive Care 11 Years and Older, Female Preventive care refers to lifestyle choices and visits with your health care provider that can promote health and wellness. This includes:  A yearly physical exam. This is also called an annual well check.  Regular dental and eye exams.  Immunizations.  Screening for certain conditions.  Healthy lifestyle choices, such as diet and exercise. What can I expect for my preventive care visit? Physical exam Your health care provider will check:  Height and weight. These may be used to calculate body mass index (BMI), which is a measurement that tells if you are at a healthy weight.  Heart rate and blood pressure.  Your skin for abnormal spots. Counseling Your health care provider may ask you questions about:  Alcohol, tobacco, and drug use.  Emotional well-being.  Home and relationship well-being.  Sexual activity.  Eating habits.  History of falls.  Memory and ability to understand (cognition).  Work and work Statistician.  Pregnancy and menstrual history. What immunizations do I need?  Influenza (flu) vaccine  This is recommended every year. Tetanus, diphtheria, and pertussis (Tdap) vaccine  You may need a Td booster every 10 years. Varicella (chickenpox) vaccine  You may need this vaccine if you have not already been vaccinated. Zoster (shingles) vaccine  You may need this after age 28. Pneumococcal conjugate (PCV13) vaccine  One dose is recommended after age 58. Pneumococcal polysaccharide (PPSV23) vaccine  One dose is recommended after age 85. Measles, mumps, and rubella (MMR) vaccine  You may need at least one dose of MMR if you were born in 1957 or later. You may also need a second  dose. Meningococcal conjugate (MenACWY) vaccine  You may need this if you have certain conditions. Hepatitis A vaccine  You may need this if you have certain conditions or if you travel or work in places where you may be exposed to hepatitis A. Hepatitis B vaccine  You may need this if you have certain conditions or if you travel or work in places where you may be exposed to hepatitis B. Haemophilus influenzae type b (Hib) vaccine  You may need this if you have certain conditions. You may receive vaccines as individual doses or as more than one vaccine together in one shot (combination vaccines). Talk with your health care provider about the risks and benefits of combination vaccines. What tests do I need? Blood tests  Lipid and cholesterol levels. These may be checked every 5 years, or more frequently depending on your overall health.  Hepatitis C test.  Hepatitis B test. Screening  Lung cancer screening. You may have this screening every year starting at age 79 if you have a 30-pack-year history of smoking and currently smoke or have quit within the past 15 years.  Colorectal cancer screening. All adults should have this screening starting at age 53 and continuing until age 49. Your health care provider may recommend screening at age 58 if you are at increased risk. You will have tests every 1-10 years, depending on your results and the type of screening test.  Diabetes screening. This is done by checking your blood sugar (glucose) after you have not eaten for a while (fasting). You may have this done every 1-3 years.  Mammogram. This may  be done every 1-2 years. Talk with your health care provider about how often you should have regular mammograms.  BRCA-related cancer screening. This may be done if you have a family history of breast, ovarian, tubal, or peritoneal cancers. Other tests  Sexually transmitted disease (STD) testing.  Bone density scan. This is done to screen for  osteoporosis. You may have this done starting at age 65. Follow these instructions at home: Eating and drinking  Eat a diet that includes fresh fruits and vegetables, whole grains, lean protein, and low-fat dairy products. Limit your intake of foods with high amounts of sugar, saturated fats, and salt.  Take vitamin and mineral supplements as recommended by your health care provider.  Do not drink alcohol if your health care provider tells you not to drink.  If you drink alcohol: ? Limit how much you have to 0-1 drink a day. ? Be aware of how much alcohol is in your drink. In the [place] one drink equals one 12 oz bottle of beer (355 mL), one 5 oz glass of wine (148 mL), or one 1 oz glass of hard liquor (44 mL). Lifestyle  Take daily care of your teeth and gums.  Stay active. Exercise for at least 30 minutes on 5 or more days each week.  Do not use any products that contain nicotine or tobacco, such as cigarettes, e-cigarettes, and chewing tobacco. If you need help quitting, ask your health care provider.  If you are sexually active, practice safe sex. Use a condom or other form of protection in order to prevent STIs (sexually transmitted infections).  Talk with your health care provider about taking a low-dose aspirin or statin. What's next?  Go to your health care provider once a year for a well check visit.  Ask your health care provider how often you should have your eyes and teeth checked.  Stay up to date on all vaccines. This information is not intended to replace advice given to you by your health care provider. Make sure you discuss any questions you have with your health care provider. Document Revised: 07/06/2018 Document Reviewed: 07/06/2018 Elsevier Patient Education  2020 [employer]  

## 2019-11-08 NOTE — Progress Notes (Addendum)
Medicare Initial Preventative Physical Exam   Patient: Mckenzie Willis, Female    DOB: Dec 03, 1950, 69 y.o.   MRN: [id number] Visit Date: 11/08/2019  Today's Provider: Lavon Paganini, MD   Subjective:    Chief Complaint  Patient presents with  . Medicare Wellness   I,Mckenzie Willis,acting as a scribe for Lavon Paganini, MD.,have documented all relevant documentation on the behalf of Lavon Paganini, MD,as directed by  Lavon Paganini, MD while in the presence of Lavon Paganini, MD.   Medicare Initial Preventative Physical Exam Mckenzie Willis is a 69 y.o. female who presents today for her Initial Preventative Physical Exam.   HPI  Social History   Socioeconomic History  . Marital status: Married    Spouse name: Not on file  . Number of children: 3  . Years of education: Not on file  . Highest education level: Not on file  Occupational History  . Occupation: retired [employer]  Tobacco Use  . Smoking status: Former Smoker    Packs/day: 0.75    Years: 10.00    Pack years: 7.50    Types: Cigarettes    Quit date: 1983    Years since quitting: 38.3  . Smokeless tobacco: Never Used  Substance and Sexual Activity  . Alcohol use: Yes    Alcohol/week: 1.0 - 4.0 standard drinks    Types: 1 - 4 Glasses of wine per week  . Drug use: No  . Sexual activity: Not Currently  Other Topics Concern  . Not on file  Social History Narrative   Lives in [place] with husband and mother. Has 2 dogs in home.      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 Difficulty of Paying Living Expenses:   Food Insecurity:   . Worried About Charity fundraiser in the Last Year:   . Arboriculturist in the Last Year:   Transportation Needs:   . Film/video editor (Medical):   Marland Kitchen Lack of Transportation (Non-Medical):   Physical  Activity:   . Days of Exercise per Week:   . Minutes of Exercise per Session:   Stress:   . Feeling of Stress :   Social Connections:   . Frequency of Communication with Friends and Family:   . Frequency of Social Gatherings with Friends and Family:   . Attends Religious Services:   . Active Member of Clubs or Organizations:   . Attends Archivist Meetings:   Marland Kitchen Marital Status:   Intimate Partner Violence:   . Fear of Current or Ex-Partner:   . Emotionally Abused:   Marland Kitchen Physically Abused:   . Sexually Abused:     Past Medical History:  Diagnosis Date  . Arthritis   . History of chicken pox   . Hyperlipidemia   . Hypertension      Patient Active Problem List   Diagnosis Date Noted  . 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 Chronic pain of right knee 08/11/2019  . LLQ pain 08/11/2019  . Overweight 08/11/2019  . Family history of breast cancer 08/11/2019  . Family history of colonic polyps 08/11/2019  . Routine general medical examination at a health care facility 03/24/2015  . Hypothyroidism 03/24/2015  . Thyroid nodule 03/24/2015  . Lichen sclerosus  02/05/2015  . Benign essential HTN 10/04/2013  . HLD (hyperlipidemia) 10/04/2013    Past Surgical History:  Procedure Laterality Date  . TOE SURGERY  2018   Dr Milinda Pointer  . TUBAL LIGATION      Her family history includes Breast cancer in her maternal aunt; Breast cancer (age of onset: 42) in her paternal aunt; Cancer in her brother; Colon polyps in her mother; Diabetes in her paternal grandfather; Emphysema in her father; Heart disease in her mother; Hypertension in her brother, maternal grandmother, and mother. There is no history of Colon cancer.   Current Outpatient Medications:  .  Cholecalciferol (VITAMIN D-3) 25 MCG (1000 UT) CAPS, Take 1 capsule by mouth daily., Disp: , Rfl:  .  diclofenac Sodium (VOLTAREN) 1 % GEL, Apply topically 4 (four) times daily., Disp: , Rfl:  .  losartan-hydrochlorothiazide  (HYZAAR) 100-25 MG tablet, Take 1 tablet by mouth daily., Disp: 90 tablet, Rfl: 1 .  rosuvastatin (CRESTOR) 10 MG tablet, Take 1 tablet (10 mg total) by mouth daily., Disp: 90 tablet, Rfl: 1 .  TURMERIC PO, Take 1,000 mg by mouth daily., Disp: , Rfl:  .  vitamin B-12 (CYANOCOBALAMIN) 1000 MCG tablet, Take 1,000 mcg by mouth daily., Disp: , Rfl:    Patient Care Team: Virginia Crews, MD as PCP - General (Family Medicine)  Review of Systems  Constitutional: Negative.   HENT: Negative.   Eyes: Negative.   Respiratory: Negative.   Cardiovascular: Negative.   Gastrointestinal: Negative.   Endocrine: Negative.   Genitourinary: Negative.   Musculoskeletal: Negative.   Skin: Negative.   Allergic/Immunologic: Negative.   Neurological: Negative.   Hematological: Negative.   Psychiatric/Behavioral: Negative.        Objective:    Vitals: BP 137/70 (BP Location: Right Arm, Patient Position: Sitting, Cuff Size: Normal)   Pulse 66   Temp (!) 96.6 F (35.9 C) (Temporal)   Ht 5\' 5"  (1.651 m)   Wt 159 lb (72.1 kg)   BMI 26.46 kg/m  No exam data present Physical Exam Constitutional:      Appearance: Normal appearance.  HENT:     Head: Normocephalic and atraumatic.     Right Ear: Tympanic membrane, ear canal and external ear normal.     Left Ear: Tympanic membrane, ear canal and external ear normal.  Eyes:     Conjunctiva/sclera: Conjunctivae normal.  Cardiovascular:     Rate and Rhythm: Normal rate and regular rhythm.  Pulmonary:     Effort: Pulmonary effort is normal.     Breath sounds: Normal breath sounds.  Musculoskeletal:        General: No swelling.     Cervical back: Normal range of motion and neck supple.  Lymphadenopathy:     Cervical: No cervical adenopathy.  Skin:    General: Skin is warm and dry.  Neurological:     Mental Status: She is alert and oriented to person, place, and time. Mental status is at baseline.  Psychiatric:        Mood and Affect: Mood  normal.        Behavior: Behavior normal.        Thought Content: Thought content normal.        Judgment: Judgment normal.     Activities of Daily Living In your present state of health, do you have any difficulty performing the following activities: 11/08/2019 08/09/2019  Hearing? N N  Vision? N N  Difficulty concentrating or making decisions? N N  Walking or climbing stairs? N N  Dressing or bathing? N N  Doing errands, shopping? N N  Some recent data might be hidden    Fall Risk Assessment Fall Risk  08/09/2019  Falls in the past year? 0  Number falls in past yr: 0  Injury with Fall? 0  Follow up Falls evaluation completed     Depression Screen PHQ 2/9 Scores 11/08/2019 08/09/2019  PHQ - 2 Score 0 0  PHQ- 9 Score 0 0    6CIT Screen 11/08/2019  What Year? 0 points  What month? 0 points  What time? 0 points  Count back from 20 0 points  Months in reverse 2 points  Repeat phrase 0 points  Total Score 2    No results found for any visits on 11/08/19.   Assessment & Plan:    Initial Preventative Physical Exam  Reviewed patient's Family Medical History Reviewed and updated list of patient's medical providers Assessment of cognitive impairment was done Assessed patient's functional ability Established a written schedule for health screening [place] Completed and Reviewed  Exercise Activities and Dietary recommendations Goals   None     Immunization History  Administered Date(s) Administered  . Influenza-Unspecified 06/26/2015    Health Maintenance  Topic Date Due  . TETANUS/TDAP  Never done  . DEXA SCAN  Never done  . PNA vac Low Risk Adult (1 of 2 - PCV13) Never done  . COLONOSCOPY  03/26/2019  . INFLUENZA VACCINE  02/24/2020  . MAMMOGRAM  12/24/2020  . Hepatitis C Screening  Completed     Discussed health benefits of physical activity, and encouraged her to engage in regular exercise appropriate for her age and  condition.  Dicussed in dept about vaccines, pt declined.     Problem List Items Addressed This Visit    None    Visit Diagnoses    Welcome to Medicare preventive visit    -  Primary   Relevant Orders   EKG 12-Lead (Completed)   Postmenopausal estrogen deficiency       Relevant Orders   DG Bone Density   Encounter for screening mammogram for breast cancer       Relevant Orders   MM Digital Screening       Return in about 6 months (around 05/09/2020) for chronic disease f/u.     I, Lavon Paganini, MD, have reviewed all documentation for this visit. The documentation on 11/08/19 for the exam, diagnosis, procedures, and orders are all accurate and complete.   Ludia Gartland, Dionne Bucy, MD, MPH [facility]

## 2019-11-19 ENCOUNTER — Encounter: Payer: Self-pay | Admitting: Physical Therapy

## 2019-11-20 ENCOUNTER — Other Ambulatory Visit: Payer: Self-pay

## 2019-11-20 ENCOUNTER — Encounter: Payer: Self-pay | Admitting: Physical Therapy

## 2019-11-20 ENCOUNTER — Ambulatory Visit: Payer: PPO | Admitting: Physical Therapy

## 2019-11-20 DIAGNOSIS — R293 Abnormal posture: Secondary | ICD-10-CM

## 2019-11-20 DIAGNOSIS — M6281 Muscle weakness (generalized): Secondary | ICD-10-CM

## 2019-11-20 DIAGNOSIS — M545 Low back pain, unspecified: Secondary | ICD-10-CM

## 2019-11-20 DIAGNOSIS — G8929 Other chronic pain: Secondary | ICD-10-CM

## 2019-11-20 NOTE — Therapy (Signed)
[facility] [address] [place], [place], [address] Phone: [phone]   Fax:  [phone]  Physical Therapy Treatment  Patient Details  Name: Mckenzie Willis MRN: [id number] Date of Birth: [birth year] Referring Provider (PT): Lavon Paganini   Encounter Date: 11/20/2019  PT End of Session - 11/20/19 1006    Visit Number  9    Number of Visits  13    Date for PT Re-Evaluation  11/20/19    PT Start Time  0949    PT Stop Time  1043    PT Time Calculation (min)  54 min    Activity Tolerance  Patient tolerated treatment well    Behavior During Therapy  [facility] for tasks assessed/performed       Past Medical History:  Diagnosis Date  . Arthritis   . History of chicken pox   . Hyperlipidemia   . Hypertension     Past Surgical History:  Procedure Laterality Date  . TOE SURGERY  2018   Dr Milinda Pointer  . TUBAL LIGATION      There were no vitals filed for this visit.  Subjective Assessment - 11/20/19 0953    Subjective  Patient notes that she feels some stiffness in her back in the mornings but her bed exercises usually calm this down. Patient noticed pain this past week when she was doing her figure 4 stretch. Patient notes that she has more symmetrical pain across the low back. She also notes a continued audible clunk in her R hip when she hugs her R knee to her chest.    Currently in Pain?  No/denies      TREATMENT  Neuromuscular Re-education: Reassessed goals; see below. Reviewed pain modulation strategies including: graded activity/activity chunks, gentle motion, and progressive load. Floor transfers without external support. Patient required minimal to no VCs to perform safely in consideration of balance and bone density.   Patient educated throughout session on appropriate technique and form using multi-modal cueing, HEP, and activity modification. Patient articulated understanding and returned demonstration.  Patient  Response to interventions: Patient reports confidence in self-management.  ASSESSMENT Patient presents to clinic with excellent motivation to participate in therapy. Patient demonstrates minimal remaining deficits in SIJ motion B, pelvic posture, lumbar spine ROM, RLE strength, and balance. Patient has achieved/surpassed all goals set forth at initiation of physical therapy and demonstrate excellent understanding of self-management priniciples. At this time patient is appropriate for discharge to self-management of therapeutic gains made with respect to SIJ motion B, pelvic posture, lumbar spine ROM, RLE strength, and balance in order to maintain function and QOL.     PT Long Term Goals - 11/20/19 1017      PT LONG TERM GOAL #1   Title  Patient will be independent with HEP in order to improve strength and decrease back pain in order to improve pain-free function at home and work.    Baseline  IE: provided; 4/27: IND    Time  12    Period  Weeks    Status  Achieved      PT LONG TERM GOAL #2   Title  Patient will demonstrate improved function as evidenced by a score of 68 on FOTO measure for full participation in activities at home and in the community.    Baseline  IE: 55; 4/27: 79    Time  12    Period  Weeks    Status  Achieved  PT LONG TERM GOAL #3   Title  Patient will demonstrate ability to walk on an inclined surface with pain < 4/10 NPRS for duration > 5 minutes in order to participate in preferred wellness activities.    Baseline  IE: unable; 4/12:"10 min walking on treadmill at 1.0 speed, with incline 0.0-2.5% for improved tolerance to pain provoking activity (1/10 pain at end, symmetrical)"    Time  12    Period  Weeks    Status  Achieved      PT LONG TERM GOAL #4   Title  Patient will decrease worst back pain as reported on NPRS by at least 2 points in order to demonstrate clinically significant reduction in back pain.    Baseline  IE: 9/10; 4/27: 4/10    Time  12     Period  Weeks    Status  Achieved      PT LONG TERM GOAL #5   Title  Patient will increase strength of RLE by at least 1/2 MMT grade in order to demonstrate improvement in strength and function.    Baseline  IE: 3+/5; 4/27: 4/5    Time  12    Period  Weeks    Status  Achieved      PT LONG TERM GOAL #6   Title  Patient will demonstrate bone density appropriate body mechanics for lifting, bending, and twisting activities to maintain bone health and decrease risk of fracture.    Baseline  IE: not demonstrated; 4/27: IND    Time  12    Period  Weeks    Status  Achieved            Plan - 11/20/19 1006    Clinical Impression Statement  Patient presents to clinic with excellent motivation to participate in therapy. Patient demonstrates minimal remaining deficits in SIJ motion B, pelvic posture, lumbar spine ROM, RLE strength, and balance. Patient has achieved/surpassed all goals set forth at initiation of physical therapy and demonstrate excellent understanding of self-management priniciples. At this time patient is appropriate for discharge to self-management of therapeutic gains made with respect to SIJ motion B, pelvic posture, lumbar spine ROM, RLE strength, and balance in order to maintain function and QOL.    Personal Factors and Comorbidities  Age;Fitness;Past/Current Experience;Behavior Pattern;Comorbidity 3+;Sex;Profession;Time since onset of injury/illness/exacerbation    Comorbidities  Arthritis, Hyperlipidemia, Hypertension    Examination-Activity Limitations  Bed Mobility;Squat;Bend;Lift;Locomotion Level;Carry;Transfers;Sleep    Examination-Participation Restrictions  Church;Meal Prep;Cleaning;Community Activity;Dorita Sciara    Stability/Clinical Decision Making  Evolving/Moderate complexity    Rehab Potential  Fair    PT Frequency  One time visit    PT Duration  --    PT Treatment/Interventions  ADLs/Self Care Home Management;Cryotherapy;Moist Heat;Iontophoresis 4mg /ml  Dexamethasone;Electrical Stimulation;Neuromuscular re-education;Balance training;Therapeutic exercise;Therapeutic activities;Functional mobility training;Stair training;Gait training;Patient/family education;Manual techniques;Joint Manipulations;Spinal Manipulations;Taping;Scar mobilization;Dry needling;Passive range of motion;Energy conservation    PT Next Visit Plan  --    PT Home Exercise Plan  --    Consulted and Agree with Plan of Care  Patient       Patient will benefit from skilled therapeutic intervention in order to improve the following deficits and impairments:  Abnormal gait, Decreased balance, Decreased mobility, Difficulty walking, Pain, Postural dysfunction, Impaired flexibility, Decreased strength, Decreased activity tolerance, Decreased range of motion, Improper body mechanics, Decreased scar mobility, Hypomobility  Visit Diagnosis: Abnormal posture  Muscle weakness (generalized)  Chronic low back pain, unspecified back pain laterality, unspecified whether sciatica present  Problem List Patient Active Problem List   Diagnosis Date Noted  . 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 Chronic pain of right knee 08/11/2019  . LLQ pain 08/11/2019  . Overweight 08/11/2019  . Family history of breast cancer 08/11/2019  . Family history of colonic polyps 08/11/2019  . Routine general medical examination at a health care facility 03/24/2015  . Hypothyroidism 03/24/2015  . Thyroid nodule 03/24/2015  . Lichen sclerosus [id number]  . Benign essential HTN 10/04/2013  . HLD (hyperlipidemia) 10/04/2013   Myles Gip PT, DPT [phone] 11/20/2019, 12:10 PM  [facility] [address] [place], [place], [address] Phone: [phone]   Fax:  [phone]  Name: Mckenzie Willis MRN: [id number] Date of Birth: [birth year]

## 2019-12-06 ENCOUNTER — Telehealth: Payer: Self-pay

## 2019-12-06 NOTE — Telephone Encounter (Signed)
Copied from [place] [id number]. Topic: General - Other >> Dec 06, 2019 12:20 PM Rainey Pines A wrote: Patient stated that she was recently seen and wants to know if she can drop off a urine sample due to frequent urination. Please advise

## 2019-12-07 NOTE — Telephone Encounter (Signed)
LMTCB 12/07/2019.  PEC please offer pt an appointment when she calls back.    Thanks,   -Mickel Baas

## 2019-12-07 NOTE — Telephone Encounter (Signed)
She was seen for WTM visit, not for the urination issue.  She will need OV for this new problem - virtual ok if she prefers

## 2020-01-01 ENCOUNTER — Ambulatory Visit
Admission: RE | Admit: 2020-01-01 | Discharge: 2020-01-01 | Disposition: A | Discharge status: Home / Self Care | Payer: PPO | Source: Ambulatory Visit | Attending: Family Medicine | Admitting: Family Medicine

## 2020-01-01 DIAGNOSIS — Z780 Asymptomatic menopausal state: Secondary | ICD-10-CM | POA: Diagnosis not present

## 2020-01-01 DIAGNOSIS — Z1231 Encounter for screening mammogram for malignant neoplasm of breast: Secondary | ICD-10-CM | POA: Insufficient documentation

## 2020-01-01 DIAGNOSIS — M8589 Other specified disorders of bone density and structure, multiple sites: Secondary | ICD-10-CM | POA: Diagnosis not present

## 2020-01-02 ENCOUNTER — Telehealth: Payer: Self-pay

## 2020-01-02 NOTE — Telephone Encounter (Signed)
-----   Message from Virginia Crews, MD sent at 01/02/2020 11:27 AM EDT ----- Bone density scan shows osteopenia (this is some bone loss, but not as bad as osteoporosis).  Recommend regular weight bearing exercise, avoiding smoking, and adequate Ca (1200mg /day) and Vit D (1000 units daily) via diet or supplement.  We will recheck in 2 years to ensure this hasn't worsened.

## 2020-01-02 NOTE — Telephone Encounter (Signed)
Patient advised as below.  

## 2020-01-02 NOTE — Telephone Encounter (Signed)
Patient advised as below. Patient verbalizes understanding and is in agreement with treatment plan.  

## 2020-01-02 NOTE — Telephone Encounter (Signed)
-----   Message from Virginia Crews, MD sent at 01/02/2020 12:49 PM EDT ----- Normal mammogram. Repeat in 1 yr

## 2020-01-09 ENCOUNTER — Other Ambulatory Visit: Payer: Self-pay

## 2020-01-09 ENCOUNTER — Other Ambulatory Visit
Admission: RE | Admit: 2020-01-09 | Discharge: 2020-01-09 | Disposition: A | Discharge status: Home / Self Care | Payer: PPO | Source: Ambulatory Visit | Attending: General Surgery | Admitting: General Surgery

## 2020-01-09 DIAGNOSIS — Z01812 Encounter for preprocedural laboratory examination: Secondary | ICD-10-CM | POA: Diagnosis not present

## 2020-01-09 DIAGNOSIS — Z20822 Contact with and (suspected) exposure to covid-19: Secondary | ICD-10-CM | POA: Insufficient documentation

## 2020-01-09 LAB — SARS CORONAVIRUS 2 (TAT 6-24 HRS): SARS Coronavirus 2: NEGATIVE

## 2020-01-10 ENCOUNTER — Encounter: Payer: Self-pay | Admitting: General Surgery

## 2020-01-11 ENCOUNTER — Encounter
Admission: RE | Disposition: A | Discharge status: Home / Self Care | Payer: Self-pay | Source: Home / Self Care | Attending: General Surgery

## 2020-01-11 ENCOUNTER — Ambulatory Visit
Admission: RE | Admit: 2020-01-11 | Discharge: 2020-01-11 | Disposition: A | Discharge status: Home / Self Care | Payer: PPO | Attending: General Surgery | Admitting: General Surgery

## 2020-01-11 ENCOUNTER — Other Ambulatory Visit: Payer: Self-pay

## 2020-01-11 ENCOUNTER — Encounter: Payer: Self-pay | Admitting: General Surgery

## 2020-01-11 ENCOUNTER — Ambulatory Visit: Payer: PPO | Admitting: Anesthesiology

## 2020-01-11 DIAGNOSIS — I10 Essential (primary) hypertension: Secondary | ICD-10-CM | POA: Insufficient documentation

## 2020-01-11 DIAGNOSIS — E785 Hyperlipidemia, unspecified: Secondary | ICD-10-CM | POA: Insufficient documentation

## 2020-01-11 DIAGNOSIS — Z87891 Personal history of nicotine dependence: Secondary | ICD-10-CM | POA: Diagnosis not present

## 2020-01-11 DIAGNOSIS — Z1211 Encounter for screening for malignant neoplasm of colon: Secondary | ICD-10-CM | POA: Insufficient documentation

## 2020-01-11 DIAGNOSIS — M1990 Unspecified osteoarthritis, unspecified site: Secondary | ICD-10-CM | POA: Insufficient documentation

## 2020-01-11 DIAGNOSIS — Z8371 Family history of colonic polyps: Secondary | ICD-10-CM | POA: Diagnosis not present

## 2020-01-11 DIAGNOSIS — Z79899 Other long term (current) drug therapy: Secondary | ICD-10-CM | POA: Insufficient documentation

## 2020-01-11 HISTORY — DX: Other chronic pain: G89.29

## 2020-01-11 HISTORY — DX: Personal history of other infectious and parasitic diseases: Z86.19

## 2020-01-11 HISTORY — PX: COLONOSCOPY WITH PROPOFOL: SHX5780

## 2020-01-11 HISTORY — DX: Plantar fascial fibromatosis: M72.2

## 2020-01-11 SURGERY — COLONOSCOPY WITH PROPOFOL
Anesthesia: General

## 2020-01-11 MED ORDER — LIDOCAINE HCL (PF) 2 % IJ SOLN
INTRAMUSCULAR | Status: AC
  Filled 2020-01-11: qty 5

## 2020-01-11 MED ORDER — LIDOCAINE HCL (CARDIAC) PF 100 MG/5ML IV SOSY
PREFILLED_SYRINGE | INTRAVENOUS | Status: DC | PRN
  Administered 2020-01-11: 60 mg via INTRAVENOUS

## 2020-01-11 MED ORDER — FENTANYL CITRATE (PF) 100 MCG/2ML IJ SOLN
INTRAMUSCULAR | Status: DC | PRN
  Administered 2020-01-11: 25 ug via INTRAVENOUS

## 2020-01-11 MED ORDER — SODIUM CHLORIDE 0.9 % IV SOLN
INTRAVENOUS | Status: DC
  Administered 2020-01-11: 1000 mL via INTRAVENOUS

## 2020-01-11 MED ORDER — PROPOFOL 500 MG/50ML IV EMUL
INTRAVENOUS | Status: DC | PRN
  Administered 2020-01-11: 50 ug/kg/min via INTRAVENOUS

## 2020-01-11 MED ORDER — FENTANYL CITRATE (PF) 100 MCG/2ML IJ SOLN
INTRAMUSCULAR | Status: AC
  Filled 2020-01-11: qty 2

## 2020-01-11 MED ORDER — MIDAZOLAM HCL 2 MG/2ML IJ SOLN
INTRAMUSCULAR | Status: AC
  Filled 2020-01-11: qty 2

## 2020-01-11 MED ORDER — PROPOFOL 10 MG/ML IV BOLUS
INTRAVENOUS | Status: DC | PRN
  Administered 2020-01-11: 20 mg via INTRAVENOUS
  Administered 2020-01-11: 30 mg via INTRAVENOUS

## 2020-01-11 MED ORDER — MIDAZOLAM HCL 2 MG/2ML IJ SOLN
INTRAMUSCULAR | Status: DC | PRN
  Administered 2020-01-11: 2 mg via INTRAVENOUS

## 2020-01-11 NOTE — Op Note (Signed)
[facility] Patient Name: Mckenzie Willis Procedure Date: 01/11/2020 11:19 AM MRN: [id number] Account #: [id number] Date of Birth: May 18, 1951 Admit Type: Outpatient Age: 69 Room: [facility] ENDO ROOM 1 Gender: Female Note Status: Finalized Procedure:             Colonoscopy Indications:           Family history of multiple colonic polyps in a first                         degree relative. Providers:             Robert Bellow, MD Referring MD:          Dionne Bucy. Bacigalupo (Referring MD) Medicines:             Monitored Anesthesia Care Complications:         No immediate complications. Procedure:             Pre-Anesthesia Assessment:                        - Prior to the procedure, a History and Physical was                         performed, and patient medications, allergies and                         sensitivities were reviewed. The patient's tolerance                         of previous anesthesia was reviewed.                        - The risks and benefits of the procedure and the                         sedation options and risks were discussed with the                         patient. All questions were answered and informed                         consent was obtained.                        After obtaining informed consent, the colonoscope was                         passed under direct vision. Throughout the procedure,                         the patient's blood pressure, pulse, and oxygen                         saturations were monitored continuously. The was                         introduced through the anus and advanced to the the                         cecum, identified  by appendiceal orifice and ileocecal                         valve. The colonoscopy was performed without                         difficulty. The patient tolerated the procedure well.                         The quality of the bowel preparation was  excellent. Findings:      The entire examined colon appeared normal on direct and retroflexion       views. Impression:            - The entire examined colon is normal on direct and                         retroflexion views.                        - No specimens collected. Recommendation:        - Repeat colonoscopy in 5 years for surveillance. Procedure Code(s):     --- Professional ---                        [phone], Colonoscopy, flexible; diagnostic, including                         collection of specimen(s) by brushing or washing, when                         performed (separate procedure) CPT copyright 2019 American Medical Association. All rights reserved. The codes documented in this report are preliminary and upon coder review may  be revised to meet current compliance requirements. Robert Bellow, MD 01/11/2020 11:50:37 AM This report has been signed electronically. Number of Addenda: 0 Note Initiated On: 01/11/2020 11:19 AM Scope Withdrawal Time: 0 hours 8 minutes 24 seconds  Total Procedure Duration: 0 hours 14 minutes 58 seconds       [facility]

## 2020-01-11 NOTE — Anesthesia Postprocedure Evaluation (Signed)
Anesthesia Post Note  Patient: [name]  Procedure(s) Performed: COLONOSCOPY WITH PROPOFOL (N/A )  Patient location during evaluation: Endoscopy Anesthesia Type: General Level of consciousness: awake and alert and oriented Pain management: pain level controlled Vital Signs Assessment: post-procedure vital signs reviewed and stable Respiratory status: spontaneous breathing, nonlabored ventilation and respiratory function stable Cardiovascular status: blood pressure returned to baseline and stable Postop Assessment: no signs of nausea or vomiting Anesthetic complications: no   No complications documented.   Last Vitals:  Vitals:   01/11/20 1048 01/11/20 1152  BP: 110/67   Pulse: 81   Resp: 18   Temp: (!) 36.3 C (!) 36.1 C  SpO2: 95%     Last Pain:  Vitals:   01/11/20 1212  TempSrc:   PainSc: 0-No pain                 Chariah Bailey

## 2020-01-11 NOTE — H&P (Signed)
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 High risk based on mother with innumerable polyps in past.  Tolerated prep well.      Medications Prior to Admission  Medication Sig Dispense Refill Last Dose  . losartan-hydrochlorothiazide (HYZAAR) 100-25 MG tablet Take 1 tablet by mouth daily. 90 tablet 1 01/11/2020 at Unknown time  . Cholecalciferol (VITAMIN D-3) 25 MCG (1000 UT) CAPS Take 1 capsule by mouth daily.     . diclofenac Sodium (VOLTAREN) 1 % GEL Apply topically 4 (four) times daily.     . rosuvastatin (CRESTOR) 10 MG tablet Take 1 tablet (10 mg total) by mouth daily. 90 tablet 1   . TURMERIC PO Take 1,000 mg by mouth daily.     . vitamin B-12 (CYANOCOBALAMIN) 1000 MCG tablet Take 1,000 mcg by mouth daily.      No Known Allergies Past Medical History:  Diagnosis Date  . Arthritis   . Back pain, chronic   . History of chicken pox   . History of measles   . History of mumps   . Hyperlipidemia   . Hypertension   . Plantar fasciitis    Past Surgical History:  Procedure Laterality Date  . COLONOSCOPY WITH PROPOFOL    . TOE SURGERY  2018   Dr Milinda Pointer  . TUBAL LIGATION     Social History   Socioeconomic History  . Marital status: Married    Spouse name: Not on file  . Number of children: 3  . Years of education: Not on file  . Highest education level: Not on file  Occupational History  . Occupation: retired [employer]  Tobacco Use  . Smoking status: Former Smoker    Packs/day: 0.75    Years: 10.00    Pack years: 7.50    Types: Cigarettes    Quit date: 1983    Years since quitting: 38.4  . Smokeless tobacco: Never Used  Vaping Use  . Vaping Use: Never used  Substance and Sexual Activity  . Alcohol use: Yes    Alcohol/week: 1.0 - 4.0 standard drink    Types: 1 - 4 Glasses of wine per week  . Drug use: No  . Sexual activity: Not Currently  Other Topics Concern   . Not on file  Social History Narrative   Lives in [place] with husband and mother. Has 2 dogs in home.      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 Difficulty of Paying Living Expenses:   Food Insecurity:   . Worried About Charity fundraiser in the Last Year:   . Arboriculturist in the Last Year:   Transportation Needs:   . Film/video editor (Medical):   Marland Kitchen Lack of Transportation (Non-Medical):   Physical Activity:   . Days of Exercise per Week:   . Minutes of Exercise per Session:   Stress:   . Feeling of Stress :   Social Connections:   . Frequency of Communication with Friends and Family:   . Frequency of Social Gatherings with Friends and Family:   . Attends Religious Services:   . Active Member of Clubs or Organizations:   . Attends Club or  Organization Meetings:   Marland Kitchen Marital Status:   Intimate Partner Violence:   . Fear of Current or Ex-Partner:   . Emotionally Abused:   Marland Kitchen Physically Abused:   . Sexually Abused:    Social History   Social History Narrative   Lives in [place] with husband and mother. Has 2 dogs in home.      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     PE: HEENT: Negative. Lungs: Clear. Cardio: RR.   Assessment/Plan:  Proceed with planned endoscopy.   Forest Gleason [facility] 01/11/2020

## 2020-01-11 NOTE — Transfer of Care (Signed)
Immediate Anesthesia Transfer of Care Note  Patient: Mckenzie Willis  Procedure(s) Performed: COLONOSCOPY WITH PROPOFOL (N/A )  Patient Location: PACU  Anesthesia Type:General  Level of Consciousness: awake, alert  and oriented  Airway & Oxygen Therapy: Patient Spontanous Breathing  Post-op Assessment: Report given to RN and Post -op Vital signs reviewed and stable  Post vital signs: Reviewed and stable  Last Vitals:  Vitals Value Taken Time  BP 89/61 01/11/20 1153  Temp    Pulse 72 01/11/20 1154  Resp 21 01/11/20 1154  SpO2 98 % 01/11/20 1154  Vitals shown include unvalidated device data.  Last Pain:  Vitals:   01/11/20 1048  TempSrc: Tympanic  PainSc: 0-No pain         Complications: No complications documented.

## 2020-01-11 NOTE — Anesthesia Preprocedure Evaluation (Signed)
Anesthesia Evaluation  Patient identified by MRN, date of birth, ID band Patient awake    Reviewed: Allergy & Precautions, NPO status , Patient's Chart, lab work & pertinent test results  History of Anesthesia Complications Negative for: history of anesthetic complications  Airway Mallampati: II  TM Distance: >3 FB Neck ROM: Full    Dental no notable dental hx.    Pulmonary neg sleep apnea, neg COPD, former smoker,    breath sounds clear to auscultation- rhonchi (-) wheezing      Cardiovascular Exercise Tolerance: Good hypertension, Pt. on medications (-) CAD, (-) Past MI, (-) Cardiac Stents and (-) CABG  Rhythm:Regular Rate:Normal - Systolic murmurs and - Diastolic murmurs    Neuro/Psych neg Seizures negative neurological ROS  negative psych ROS   GI/Hepatic negative GI ROS, Neg liver ROS,   Endo/Other  neg diabetesHypothyroidism   Renal/GU negative Renal ROS     Musculoskeletal  (+) Arthritis ,   Abdominal (+) - obese,   Peds  Hematology negative hematology ROS (+)   Anesthesia Other Findings Past Medical History: No date: Arthritis No date: Back pain, chronic No date: History of chicken pox No date: History of measles No date: History of mumps No date: Hyperlipidemia No date: Hypertension No date: Plantar fasciitis   Reproductive/Obstetrics                             Anesthesia Physical Anesthesia Plan  ASA: II  Anesthesia Plan: General   Post-op Pain Management:    Induction: Intravenous  PONV Risk Score and Plan: 2 and Propofol infusion  Airway Management Planned: Natural Airway  Additional Equipment:   Intra-op Plan:   Post-operative Plan:   Informed Consent: I have reviewed the patients History and Physical, chart, labs and discussed the procedure including the risks, benefits and alternatives for the proposed anesthesia with the patient or authorized  representative who has indicated his/her understanding and acceptance.     Dental advisory given  Plan Discussed with: CRNA and Anesthesiologist  Anesthesia Plan Comments:         Anesthesia Quick Evaluation

## 2020-01-14 ENCOUNTER — Encounter: Payer: Self-pay | Admitting: General Surgery

## 2020-02-08 ENCOUNTER — Other Ambulatory Visit: Payer: Self-pay | Admitting: Family Medicine

## 2020-02-08 NOTE — Telephone Encounter (Signed)
Requested Prescriptions  Pending Prescriptions Disp Refills  . losartan-hydrochlorothiazide (HYZAAR) 100-25 MG tablet [Pharmacy Med Name: LOSARTAN-HCTZ 100-25 MG TAB] 90 tablet 0    Sig: TAKE 1 TABLET BY MOUTH EVERY DAY     Cardiovascular: ARB + Diuretic Combos Passed - 02/08/2020  9:58 AM      Passed - K in normal range and within 180 days    Potassium  Date Value Ref Range Status  09/11/2019 4.2 3.5 - 5.2 mmol/L Final         Passed - Na in normal range and within 180 days    Sodium  Date Value Ref Range Status  09/11/2019 138 134 - 144 mmol/L Final         Passed - Cr in normal range and within 180 days    Creatinine, Ser  Date Value Ref Range Status  09/11/2019 0.71 0.57 - 1.00 mg/dL Final         Passed - Ca in normal range and within 180 days    Calcium  Date Value Ref Range Status  09/11/2019 9.8 8.7 - 10.3 mg/dL Final         Passed - Patient is not pregnant      Passed - Last BP in normal range    BP Readings from Last 1 Encounters:  01/11/20 110/67         Passed - Valid encounter within last 6 months    Recent Outpatient Visits          3 months ago Welcome to [employer] preventive visit   [facility], Dionne Bucy, MD   6 months ago Chronic right SI joint pain   [place] [facility], Dionne Bucy, MD      Future Appointments            In 3 months Bacigalupo, Dionne Bucy, MD [facility], [place]

## 2020-05-07 ENCOUNTER — Other Ambulatory Visit: Payer: Self-pay | Admitting: Family Medicine

## 2020-05-07 NOTE — Telephone Encounter (Signed)
Requested Prescriptions  Pending Prescriptions Disp Refills  . rosuvastatin (CRESTOR) 10 MG tablet [Pharmacy Med Name: ROSUVASTATIN CALCIUM 10 MG TAB] 90 tablet 1    Sig: TAKE 1 TABLET BY MOUTH EVERY DAY     Cardiovascular:  Antilipid - Statins Failed - 05/07/2020  1:18 AM      Failed - LDL in normal range and within 360 days    LDL Chol Calc (NIH)  Date Value Ref Range Status  09/11/2019 77 0 - 99 mg/dL Final         Passed - Total Cholesterol in normal range and within 360 days    Cholesterol, Total  Date Value Ref Range Status  09/11/2019 168 100 - 199 mg/dL Final         Passed - HDL in normal range and within 360 days    HDL  Date Value Ref Range Status  09/11/2019 73 >39 mg/dL Final         Passed - Triglycerides in normal range and within 360 days    Triglycerides  Date Value Ref Range Status  09/11/2019 98 0 - 149 mg/dL Final         Passed - Patient is not pregnant      Passed - Valid encounter within last 12 months    Recent Outpatient Visits          6 months ago Welcome to [employer] preventive visit   [facility], Dionne Bucy, MD   9 months ago Chronic right SI joint pain   [place] [facility], Dionne Bucy, MD      Future Appointments            In 3 weeks Bacigalupo, Dionne Bucy, MD [facility], [place]

## 2020-05-16 ENCOUNTER — Ambulatory Visit: Payer: PPO | Admitting: Family Medicine

## 2020-05-29 ENCOUNTER — Ambulatory Visit (INDEPENDENT_AMBULATORY_CARE_PROVIDER_SITE_OTHER): Payer: PPO | Admitting: Family Medicine

## 2020-05-29 ENCOUNTER — Other Ambulatory Visit: Payer: Self-pay

## 2020-05-29 ENCOUNTER — Encounter: Payer: Self-pay | Admitting: Family Medicine

## 2020-05-29 VITALS — BP 152/77 | HR 69 | Temp 98.3°F | Wt 158.0 lb

## 2020-05-29 DIAGNOSIS — M533 Sacrococcygeal disorders, not elsewhere classified: Secondary | ICD-10-CM | POA: Diagnosis not present

## 2020-05-29 DIAGNOSIS — E041 Nontoxic single thyroid nodule: Secondary | ICD-10-CM | POA: Diagnosis not present

## 2020-05-29 DIAGNOSIS — G8929 Other chronic pain: Secondary | ICD-10-CM

## 2020-05-29 DIAGNOSIS — I10 Essential (primary) hypertension: Secondary | ICD-10-CM | POA: Diagnosis not present

## 2020-05-29 DIAGNOSIS — E663 Overweight: Secondary | ICD-10-CM | POA: Diagnosis not present

## 2020-05-29 DIAGNOSIS — E782 Mixed hyperlipidemia: Secondary | ICD-10-CM

## 2020-05-29 DIAGNOSIS — M25561 Pain in right knee: Secondary | ICD-10-CM

## 2020-05-29 DIAGNOSIS — R7989 Other specified abnormal findings of blood chemistry: Secondary | ICD-10-CM

## 2020-05-29 NOTE — Assessment & Plan Note (Signed)
Thyroid nodule noted on exam, last US performed in 2013 Previous radioactive Iodine uptake showed normal thyroid function US Thyroid to assess for changes TSH

## 2020-05-29 NOTE — Progress Notes (Signed)
Established patient visit   Patient: Mckenzie Willis   DOB: [birth year]   69 y.o. Female  MRN: [id number] Visit Date: 05/29/2020  Today's healthcare provider: Lavon Paganini, MD   Chief Complaint  Patient presents with  . Follow-up   Subjective    HPI   Mckenzie Willis is doing well, she is in no acute distress. She continues to have some pain in her knee and shoulder while walking, especially since she retired from [employer] in march. She does not experience any burning or tingling in her legs and she has not experienced any other migratory arthralgias or joint pain.   She has begun taking her Crestor more consistently, she has been taking her daily Hyzaar without issue. Diet 'could be better' she is working on reducing salt, but has trouble exercising more because of her back.   Social History   Tobacco Use  . Smoking status: Former Smoker    Packs/day: 0.75    Years: 10.00    Pack years: 7.50    Types: Cigarettes    Quit date: 1983    Years since quitting: 38.8  . Smokeless tobacco: Never Used  Vaping Use  . Vaping Use: Never used  Substance Use Topics  . Alcohol use: Yes    Alcohol/week: 1.0 - 4.0 standard drink    Types: 1 - 4 Glasses of wine per week  . Drug use: No       Medications: Outpatient Medications Prior to Visit  Medication Sig  . Cholecalciferol (VITAMIN D-3) 25 MCG (1000 UT) CAPS Take 1 capsule by mouth daily.  . diclofenac Sodium (VOLTAREN) 1 % GEL Apply topically 4 (four) times daily.  Marland Kitchen losartan-hydrochlorothiazide (HYZAAR) 100-25 MG tablet TAKE 1 TABLET BY MOUTH EVERY DAY  . rosuvastatin (CRESTOR) 10 MG tablet TAKE 1 TABLET BY MOUTH EVERY DAY  . vitamin B-12 (CYANOCOBALAMIN) 1000 MCG tablet Take 1,000 mcg by mouth daily.  . [DISCONTINUED] TURMERIC PO Take 1,000 mg by mouth daily.   No facility-administered medications prior to visit.    Review of Systems  Constitutional: Negative.   HENT: Negative.   Eyes: Negative.   Respiratory:  Negative.   Cardiovascular: Negative.   Gastrointestinal: Negative.   Endocrine: Negative.   Genitourinary: Negative.   Musculoskeletal: Positive for back pain.  Skin: Negative.   Allergic/Immunologic: Negative.   Neurological: Negative.   Hematological: Negative.   Psychiatric/Behavioral: Negative.        Objective    BP (!) 152/77 (BP Location: Left Arm, Patient Position: Sitting, Cuff Size: Large)   Pulse 69   Temp 98.3 F (36.8 C) (Oral)   Wt 158 lb (71.7 kg)   BMI 26.29 kg/m    Physical Exam   General: Well nourished, pleasant affect, conversational Pulm: Lungs CTA bilaterally, normal work of breathing Cards: RRR, no murmurs rubs or gallops GI: Non-distended, non-tender  No results found for any visits on 05/29/20.  Assessment & Plan     Problem List Items Addressed This Visit      Cardiovascular and Mediastinum   Benign essential HTN - Primary    Systolic 829F x2 check Discussed diet contributions to lowering blood pressure Will likely need to add a third medication: Amlodipine 10 mg  Continue Losartan 100 mg and HCTZ 25 mg  Check CMP and Cr F/u in 6 weeks      Relevant Orders   Comprehensive metabolic panel     Endocrine   Hypothyroidism    History of  abnormal TSH, not consistent with hypothyroidism      Thyroid nodule    Thyroid nodule noted on exam, last US performed in 2013 Previous radioactive Iodine uptake showed normal thyroid function US Thyroid to assess for changes TSH      Relevant Orders   TSH   US THYROID     Other   HLD (hyperlipidemia)    Has been taking Crestor daily for past month No issues with side effects from the medications FLP today      Relevant Orders   Comprehensive metabolic panel   Lipid panel   Chronic right SI joint pain    Continue SI joint pain exacerbated by walking with no radiculopathy symptoms consistently worked with PT for the last 6 months, has exercises to work on her own. Currently taking  tylenol about q 2 weeks when pain gets bad Discussed ice and rest, continue PT exercising.      Chronic pain of right knee   Overweight    Discussed importance of healthy weight management Discussed diet and exercise          Return in about 6 weeks (around 07/10/2020) for BP f/u.     Note was initiated by Mckenzie Willis, MS3   Patient seen along with MS3 student [facility]. I personally evaluated this patient along with the student, and verified all aspects of the history, physical exam, and medical decision making as documented by the student. I agree with the student's documentation and have made all necessary edits.  Mckenzie Willis, Dionne Bucy, MD, MPH [facility]

## 2020-05-29 NOTE — Assessment & Plan Note (Signed)
Has been taking Crestor daily for past month No issues with side effects from the medications FLP today

## 2020-05-29 NOTE — Assessment & Plan Note (Signed)
Continue SI joint pain exacerbated by walking with no radiculopathy symptoms consistently worked with PT for the last 6 months, has exercises to work on her own. Currently taking tylenol about q 2 weeks when pain gets bad Discussed ice and rest, continue PT exercising.

## 2020-05-29 NOTE — Assessment & Plan Note (Signed)
Discussed importance of healthy weight management Discussed diet and exercise  

## 2020-05-29 NOTE — Assessment & Plan Note (Signed)
History of abnormal TSH, not consistent with hypothyroidism

## 2020-05-29 NOTE — Patient Instructions (Signed)
DASH Eating Plan DASH stands for "Dietary Approaches to Stop Hypertension." The DASH eating plan is a healthy eating plan that has been shown to reduce high blood pressure (hypertension). It may also reduce your risk for type 2 diabetes, heart disease, and stroke. The DASH eating plan may also help with weight loss. What are tips for following this plan?  General guidelines  Avoid eating more than 2,300 mg (milligrams) of salt (sodium) a day. If you have hypertension, you may need to reduce your sodium intake to 1,500 mg a day.  Limit alcohol intake to no more than 1 drink a day for nonpregnant women and 2 drinks a day for men. One drink equals 12 oz of beer, 5 oz of wine, or 1 oz of hard liquor.  Work with your health care provider to maintain a healthy body weight or to lose weight. Ask what an ideal weight is for you.  Get at least 30 minutes of exercise that causes your heart to beat faster (aerobic exercise) most days of the week. Activities may include walking, swimming, or biking.  Work with your health care provider or diet and nutrition specialist (dietitian) to adjust your eating plan to your individual calorie needs. Reading food labels   Check food labels for the amount of sodium per serving. Choose foods with less than 5 percent of the Daily Value of sodium. Generally, foods with less than 300 mg of sodium per serving fit into this eating plan.  To find whole grains, look for the word "whole" as the first word in the ingredient list. Shopping  Buy products labeled as "low-sodium" or "no salt added."  Buy fresh foods. Avoid canned foods and premade or frozen meals. Cooking  Avoid adding salt when cooking. Use salt-free seasonings or herbs instead of table salt or sea salt. Check with your health care provider or pharmacist before using salt substitutes.  Do not fry foods. Cook foods using healthy methods such as baking, boiling, grilling, and broiling instead.  Cook with  heart-healthy oils, such as olive, canola, soybean, or sunflower oil. Meal planning  Eat a balanced diet that includes: ? 5 or more servings of fruits and vegetables each day. At each meal, try to fill half of your plate with fruits and vegetables. ? Up to 6-8 servings of whole grains each day. ? Less than 6 oz of lean meat, poultry, or fish each day. A 3-oz serving of meat is about the same size as a deck of cards. One egg equals 1 oz. ? 2 servings of low-fat dairy each day. ? A serving of nuts, seeds, or beans 5 times each week. ? Heart-healthy fats. Healthy fats called Omega-3 fatty acids are found in foods such as flaxseeds and coldwater fish, like sardines, salmon, and mackerel.  Limit how much you eat of the following: ? Canned or prepackaged foods. ? Food that is high in trans fat, such as fried foods. ? Food that is high in saturated fat, such as fatty meat. ? Sweets, desserts, sugary drinks, and other foods with added sugar. ? Full-fat dairy products.  Do not salt foods before eating.  Try to eat at least 2 vegetarian meals each week.  Eat more home-cooked food and less restaurant, buffet, and fast food.  When eating at a restaurant, ask that your food be prepared with less salt or no salt, if possible. What foods are recommended? The items listed may not be a complete list. Talk with your dietitian about   what dietary choices are best for you. Grains Whole-grain or whole-wheat bread. Whole-grain or whole-wheat pasta. Brown rice. Oatmeal. Quinoa. Bulgur. Whole-grain and low-sodium cereals. Pita bread. Low-fat, low-sodium crackers. Whole-wheat flour tortillas. Vegetables Fresh or frozen vegetables (raw, steamed, roasted, or grilled). Low-sodium or reduced-sodium tomato and vegetable juice. Low-sodium or reduced-sodium tomato sauce and tomato paste. Low-sodium or reduced-sodium canned vegetables. Fruits All fresh, dried, or frozen fruit. Canned fruit in natural juice (without  added sugar). Meat and other protein foods Skinless chicken or turkey. Ground chicken or turkey. Pork with fat trimmed off. Fish and seafood. Egg whites. Dried beans, peas, or lentils. Unsalted nuts, nut butters, and seeds. Unsalted canned beans. Lean cuts of beef with fat trimmed off. Low-sodium, lean deli meat. Dairy Low-fat (1%) or fat-free (skim) milk. Fat-free, low-fat, or reduced-fat cheeses. Nonfat, low-sodium ricotta or cottage cheese. Low-fat or nonfat yogurt. Low-fat, low-sodium cheese. Fats and oils Soft margarine without trans fats. Vegetable oil. Low-fat, reduced-fat, or light mayonnaise and salad dressings (reduced-sodium). Canola, safflower, olive, soybean, and sunflower oils. Avocado. Seasoning and other foods Herbs. Spices. Seasoning mixes without salt. Unsalted popcorn and pretzels. Fat-free sweets. What foods are not recommended? The items listed may not be a complete list. Talk with your dietitian about what dietary choices are best for you. Grains Baked goods made with fat, such as croissants, muffins, or some breads. Dry pasta or rice meal packs. Vegetables Creamed or fried vegetables. Vegetables in a cheese sauce. Regular canned vegetables (not low-sodium or reduced-sodium). Regular canned tomato sauce and paste (not low-sodium or reduced-sodium). Regular tomato and vegetable juice (not low-sodium or reduced-sodium). Pickles. Olives. Fruits Canned fruit in a light or heavy syrup. Fried fruit. Fruit in cream or butter sauce. Meat and other protein foods Fatty cuts of meat. Ribs. Fried meat. Bacon. Sausage. Bologna and other processed lunch meats. Salami. Fatback. Hotdogs. Bratwurst. Salted nuts and seeds. Canned beans with added salt. Canned or smoked fish. Whole eggs or egg yolks. Chicken or turkey with skin. Dairy Whole or 2% milk, cream, and half-and-half. Whole or full-fat cream cheese. Whole-fat or sweetened yogurt. Full-fat cheese. Nondairy creamers. Whipped toppings.  Processed cheese and cheese spreads. Fats and oils Butter. Stick margarine. Lard. Shortening. Ghee. Bacon fat. Tropical oils, such as coconut, palm kernel, or palm oil. Seasoning and other foods Salted popcorn and pretzels. Onion salt, garlic salt, seasoned salt, table salt, and sea salt. Worcestershire sauce. Tartar sauce. Barbecue sauce. Teriyaki sauce. Soy sauce, including reduced-sodium. Steak sauce. Canned and packaged gravies. Fish sauce. Oyster sauce. Cocktail sauce. Horseradish that you find on the shelf. Ketchup. Mustard. Meat flavorings and tenderizers. Bouillon cubes. Hot sauce and Tabasco sauce. Premade or packaged marinades. Premade or packaged taco seasonings. Relishes. Regular salad dressings. Where to find more information:  National Heart, Lung, and Blood Institute: www.nhlbi.nih.gov  American Heart Association: www.heart.org Summary  The DASH eating plan is a healthy eating plan that has been shown to reduce high blood pressure (hypertension). It may also reduce your risk for type 2 diabetes, heart disease, and stroke.  With the DASH eating plan, you should limit salt (sodium) intake to 2,300 mg a day. If you have hypertension, you may need to reduce your sodium intake to 1,500 mg a day.  When on the DASH eating plan, aim to eat more fresh fruits and vegetables, whole grains, lean proteins, low-fat dairy, and heart-healthy fats.  Work with your health care provider or diet and nutrition specialist (dietitian) to adjust your eating plan to your   individual calorie needs. This information is not intended to replace advice given to you by your health care provider. Make sure you discuss any questions you have with your health care provider. Document Revised: 06/24/2017 Document Reviewed: 07/05/2016 Elsevier Patient Education  2020 Elsevier Inc.  

## 2020-05-29 NOTE — Assessment & Plan Note (Signed)
Systolic 847Q x2 check Discussed diet contributions to lowering blood pressure Will likely need to add a third medication: Amlodipine 10 mg  Continue Losartan 100 mg and HCTZ 25 mg  Check CMP and Cr F/u in 6 weeks

## 2020-05-30 LAB — COMPREHENSIVE METABOLIC PANEL
ALT: 10 IU/L (ref 0–32)
AST: 16 IU/L (ref 0–40)
Albumin/Globulin Ratio: 1.7 (ref 1.2–2.2)
Albumin: 4.7 g/dL (ref 3.8–4.8)
Alkaline Phosphatase: 74 IU/L (ref 44–121)
BUN/Creatinine Ratio: 18 (ref 12–28)
BUN: 13 mg/dL (ref 8–27)
Bilirubin Total: 0.6 mg/dL (ref 0.0–1.2)
CO2: 26 mmol/L (ref 20–29)
Calcium: 10.1 mg/dL (ref 8.7–10.3)
Chloride: 101 mmol/L (ref 96–106)
Creatinine, Ser: 0.74 mg/dL (ref 0.57–1.00)
GFR calc Af Amer: 97 mL/min/{1.73_m2} (ref >59)
GFR calc non Af Amer: 84 mL/min/{1.73_m2} (ref >59)
Globulin, Total: 2.7 g/dL (ref 1.5–4.5)
Glucose: 97 mg/dL (ref 65–99)
Potassium: 4.4 mmol/L (ref 3.5–5.2)
Sodium: 141 mmol/L (ref 134–144)
Total Protein: 7.4 g/dL (ref 6.0–8.5)

## 2020-05-30 LAB — LIPID PANEL
Chol/HDL Ratio: 2.6 ratio (ref 0.0–4.4)
Cholesterol, Total: 196 mg/dL (ref 100–199)
HDL: 75 mg/dL (ref >39)
LDL Chol Calc (NIH): 106 mg/dL — ABNORMAL HIGH (ref 0–99)
Triglycerides: 85 mg/dL (ref 0–149)
VLDL Cholesterol Cal: 15 mg/dL (ref 5–40)

## 2020-05-30 LAB — TSH: TSH: 4.34 u[IU]/mL (ref 0.450–4.500)

## 2020-06-03 ENCOUNTER — Ambulatory Visit
Admission: RE | Admit: 2020-06-03 | Discharge: 2020-06-03 | Disposition: A | Discharge status: Home / Self Care | Payer: PPO | Source: Ambulatory Visit | Attending: Family Medicine | Admitting: Family Medicine

## 2020-06-03 ENCOUNTER — Other Ambulatory Visit: Payer: Self-pay

## 2020-06-03 DIAGNOSIS — E042 Nontoxic multinodular goiter: Secondary | ICD-10-CM | POA: Diagnosis not present

## 2020-06-03 DIAGNOSIS — E041 Nontoxic single thyroid nodule: Secondary | ICD-10-CM | POA: Insufficient documentation

## 2020-06-06 ENCOUNTER — Other Ambulatory Visit: Payer: Self-pay | Admitting: Family Medicine

## 2020-06-24 DIAGNOSIS — L0211 Cutaneous abscess of neck: Secondary | ICD-10-CM | POA: Diagnosis not present

## 2020-06-24 DIAGNOSIS — L720 Epidermal cyst: Secondary | ICD-10-CM | POA: Diagnosis not present

## 2020-07-01 ENCOUNTER — Other Ambulatory Visit: Payer: Self-pay | Admitting: Family Medicine

## 2020-07-16 DIAGNOSIS — L570 Actinic keratosis: Secondary | ICD-10-CM | POA: Diagnosis not present

## 2020-07-16 DIAGNOSIS — L920 Granuloma annulare: Secondary | ICD-10-CM | POA: Diagnosis not present

## 2020-07-16 DIAGNOSIS — L821 Other seborrheic keratosis: Secondary | ICD-10-CM | POA: Diagnosis not present

## 2020-07-21 ENCOUNTER — Telehealth: Payer: Self-pay

## 2020-07-21 ENCOUNTER — Other Ambulatory Visit: Payer: Self-pay | Admitting: Family

## 2020-07-21 ENCOUNTER — Telehealth: Payer: Self-pay | Admitting: Family

## 2020-07-21 DIAGNOSIS — R112 Nausea with vomiting, unspecified: Secondary | ICD-10-CM | POA: Diagnosis not present

## 2020-07-21 DIAGNOSIS — U071 COVID-19: Secondary | ICD-10-CM

## 2020-07-21 DIAGNOSIS — Z20822 Contact with and (suspected) exposure to covid-19: Secondary | ICD-10-CM | POA: Diagnosis not present

## 2020-07-21 NOTE — Telephone Encounter (Signed)
Pt returned the office call. Pt says that her sx started yesterday. Pt says that provider can disregard because she is having infusion done tomorrow with [facility].

## 2020-07-21 NOTE — Telephone Encounter (Signed)
I connected by phone with Mckenzie Willis on 07/21/2020 at 4:44 PM to discuss the potential use of a new treatment for mild to moderate COVID-19 viral infection in non-hospitalized patients.  This patient is a 69 y.o. female that meets the FDA criteria for Emergency Use Authorization of COVID monoclonal antibody casirivimab/imdevimab, bamlanivimab/etesevimab, or sotrovimab.  Has a (+) direct SARS-CoV-2 viral test result  Has mild or moderate COVID-19   Is NOT hospitalized due to COVID-19  Is within 10 days of symptom onset  Has at least one of the high risk factor(s) for progression to severe COVID-19 and/or hospitalization as defined in EUA.  Specific high risk criteria : Older age (>/= 67 yo), BMI > 25 and Cardiovascular disease or hypertension   I have spoken and communicated the following to the patient or parent/caregiver regarding COVID monoclonal antibody treatment:  1. FDA has authorized the emergency use for the treatment of mild to moderate COVID-19 in adults and pediatric patients with positive results of direct SARS-CoV-2 viral testing who are 68 years of age and older weighing at least 40 kg, and who are at high risk for progressing to severe COVID-19 and/or hospitalization.  2. The significant known and potential risks and benefits of COVID monoclonal antibody, and the extent to which such potential risks and benefits are unknown.  3. Information on available alternative treatments and the risks and benefits of those alternatives, including clinical trials.  4. Patients treated with COVID monoclonal antibody should continue to self-isolate and use infection control measures (e.g., wear mask, isolate, social distance, avoid sharing personal items, clean and disinfect "high touch" surfaces, and frequent handwashing) according to CDC guidelines.   5. The patient or parent/caregiver has the option to accept or refuse COVID monoclonal antibody treatment.  After reviewing  this information with the patient, the patient has agreed to receive one of the available covid 19 monoclonal antibodies and will be provided an appropriate fact sheet prior to infusion. Alver Sorrow, NP 07/21/2020 4:44 PM

## 2020-07-21 NOTE — Telephone Encounter (Signed)
Copied from CRM [id number]. Topic: General - Other >> Jul 21, 2020  2:03 PM Jaquita Rector A wrote: Reason for CRM: Patient called in to say that she tested positive for covid today at the [facility] and would like to be scheduled for the monoclonal infusion. Please advise Ph# [phone]

## 2020-07-21 NOTE — Telephone Encounter (Signed)
LMTCB, PEC please advise as below.

## 2020-07-21 NOTE — Telephone Encounter (Signed)
Need to know when symptoms started, what symptoms she has been having. If sx started within 7 days can send message to [facility] clinic.

## 2020-07-22 ENCOUNTER — Ambulatory Visit (HOSPITAL_COMMUNITY)
Admission: RE | Admit: 2020-07-22 | Discharge: 2020-07-22 | Disposition: A | Discharge status: Home / Self Care | Payer: Medicare Other | Source: Ambulatory Visit | Attending: Pulmonary Disease | Admitting: Pulmonary Disease

## 2020-07-22 DIAGNOSIS — Z23 Encounter for immunization: Secondary | ICD-10-CM | POA: Insufficient documentation

## 2020-07-22 DIAGNOSIS — U071 COVID-19: Secondary | ICD-10-CM | POA: Insufficient documentation

## 2020-07-22 MED ORDER — FAMOTIDINE IN NACL 20-0.9 MG/50ML-% IV SOLN: 20.0000 mg | Freq: Once | INTRAVENOUS | Status: DC | PRN

## 2020-07-22 MED ORDER — ALBUTEROL SULFATE HFA 108 (90 BASE) MCG/ACT IN AERS: 2.0000 | INHALATION_SPRAY | Freq: Once | RESPIRATORY_TRACT | Status: DC | PRN

## 2020-07-22 MED ORDER — SODIUM CHLORIDE 0.9 % IV SOLN: INTRAVENOUS | Status: DC | PRN

## 2020-07-22 MED ORDER — SODIUM CHLORIDE 0.9 % IV SOLN: Freq: Once | INTRAVENOUS | Status: AC

## 2020-07-22 MED ORDER — EPINEPHRINE 0.3 MG/0.3ML IJ SOAJ: 0.3000 mg | Freq: Once | INTRAMUSCULAR | Status: DC | PRN

## 2020-07-22 MED ORDER — METHYLPREDNISOLONE SODIUM SUCC 125 MG IJ SOLR: 125.0000 mg | Freq: Once | INTRAMUSCULAR | Status: DC | PRN

## 2020-07-22 MED ORDER — DIPHENHYDRAMINE HCL 50 MG/ML IJ SOLN: 50.0000 mg | Freq: Once | INTRAMUSCULAR | Status: DC | PRN

## 2020-07-22 NOTE — Discharge Instructions (Signed)
10 Things You Can Do to Manage Your COVID-19 Symptoms at Home If you have possible or confirmed COVID-19: 1. Stay home from work and school. And stay away from other public places. If you must go out, avoid using any kind of public transportation, ridesharing, or taxis. 2. Monitor your symptoms carefully. If your symptoms get worse, call your healthcare provider immediately. 3. Get rest and stay hydrated. 4. If you have a medical appointment, call the healthcare provider ahead of time and tell them that you have or may have COVID-19. 5. For medical emergencies, call 911 and notify the dispatch personnel that you have or may have COVID-19. 6. Cover your cough and sneezes with a tissue or use the inside of your elbow. 7. Wash your hands often with soap and water for at least 20 seconds or clean your hands with an alcohol-based hand sanitizer that contains at least 60% alcohol. 8. As much as possible, stay in a specific room and away from other people in your home. Also, you should use a separate bathroom, if available. If you need to be around other people in or outside of the home, wear a mask. 9. Avoid sharing personal items with other people in your household, like dishes, towels, and bedding. 10. Clean all surfaces that are touched often, like counters, tabletops, and doorknobs. Use household cleaning sprays or wipes according to the label instructions. cdc.gov/coronavirus 01/24/2019 This information is not intended to replace advice given to you by your health care provider. Make sure you discuss any questions you have with your health care provider. Document Revised: 06/28/2019 Document Reviewed: 06/28/2019 Elsevier Patient Education  2020 Elsevier Inc. What types of side effects do monoclonal antibody drugs cause?  Common side effects  In general, the more common side effects caused by monoclonal antibody drugs include: . Allergic reactions, such as hives or itching . Flu-like signs and  symptoms, including chills, fatigue, fever, and muscle aches and pains . Nausea, vomiting . Diarrhea . Skin rashes . Low blood pressure   The CDC is recommending patients who receive monoclonal antibody treatments wait at least 90 days before being vaccinated.  Currently, there are no data on the safety and efficacy of mRNA COVID-19 vaccines in persons who received monoclonal antibodies or convalescent plasma as part of COVID-19 treatment. Based on the estimated half-life of such therapies as well as evidence suggesting that reinfection is uncommon in the 90 days after initial infection, vaccination should be deferred for at least 90 days, as a precautionary measure until additional information becomes available, to avoid interference of the antibody treatment with vaccine-induced immune responses. If you have any questions or concerns after the infusion please call the Advanced Practice Provider on call at [phone]. This number is ONLY intended for your use regarding questions or concerns about the infusion post-treatment side-effects.  Please do not provide this number to others for use. For return to work notes please contact your primary care provider.   If someone you know is interested in receiving treatment please have them call the COVID hotline at [phone].   

## 2020-07-28 ENCOUNTER — Ambulatory Visit: Payer: PPO | Admitting: Family Medicine

## 2020-08-01 ENCOUNTER — Other Ambulatory Visit: Payer: Self-pay | Admitting: Family Medicine

## 2020-08-01 NOTE — Telephone Encounter (Signed)
Requested medication (s) are due for refill today: yes  Requested medication (s) are on the active medication list: yes  Last refill: 07/01/20  #30  0 refills  Future visit scheduled:yes  Notes to clinic: Pharmacy states on backorder. Requesting alternative Please review    Requested Prescriptions  Pending Prescriptions Disp Refills   losartan-hydrochlorothiazide (HYZAAR) 100-25 MG tablet [Pharmacy Med Name: LOSARTAN-HCTZ 100-25 MG TAB] 30 tablet 0    Sig: TAKE 1 TABLET BY MOUTH EVERY DAY      Cardiovascular: ARB + Diuretic Combos Passed - 08/01/2020 11:25 AM      Passed - K in normal range and within 180 days    Potassium  Date Value Ref Range Status  05/29/2020 4.4 3.5 - 5.2 mmol/L Final          Passed - Na in normal range and within 180 days    Sodium  Date Value Ref Range Status  05/29/2020 141 134 - 144 mmol/L Final          Passed - Cr in normal range and within 180 days    Creatinine, Ser  Date Value Ref Range Status  05/29/2020 0.74 0.57 - 1.00 mg/dL Final          Passed - Ca in normal range and within 180 days    Calcium  Date Value Ref Range Status  05/29/2020 10.1 8.7 - 10.3 mg/dL Final          Passed - Patient is not pregnant      Passed - Last BP in normal range    BP Readings from Last 1 Encounters:  07/22/20 122/66          Passed - Valid encounter within last 6 months    Recent Outpatient Visits           2 months ago Benign essential HTN   [facility] [place], Dionne Bucy, MD   8 months ago Welcome to [employer] preventive visit   [facility], Dionne Bucy, MD   11 months ago Chronic right SI joint pain   [place] [facility], Dionne Bucy, MD       Future Appointments             In 1 week Bacigalupo, Dionne Bucy, MD [facility], [place]

## 2020-08-04 MED ORDER — HYDROCHLOROTHIAZIDE 25 MG PO TABS: 25.0000 mg | ORAL_TABLET | Freq: Every day | ORAL | 1 refills | Status: DC

## 2020-08-04 NOTE — Telephone Encounter (Signed)
Ok to send in Rx for Losartan 100mg  daily and HCTZ 25 mg daily separately for 90 day supply with 1 refill.

## 2020-08-14 ENCOUNTER — Ambulatory Visit: Payer: PPO | Admitting: Family Medicine

## 2020-08-21 ENCOUNTER — Ambulatory Visit: Payer: PPO | Admitting: Family Medicine

## 2020-08-26 ENCOUNTER — Ambulatory Visit (INDEPENDENT_AMBULATORY_CARE_PROVIDER_SITE_OTHER): Payer: PPO | Admitting: Family Medicine

## 2020-08-26 ENCOUNTER — Encounter: Payer: Self-pay | Admitting: Family Medicine

## 2020-08-26 ENCOUNTER — Other Ambulatory Visit: Payer: Self-pay

## 2020-08-26 VITALS — BP 142/76 | HR 72 | Temp 98.3°F | Resp 16 | Ht 65.0 in | Wt 160.0 lb

## 2020-08-26 DIAGNOSIS — I10 Essential (primary) hypertension: Secondary | ICD-10-CM | POA: Diagnosis not present

## 2020-08-26 MED ORDER — AMLODIPINE BESYLATE 5 MG PO TABS: 5.0000 mg | ORAL_TABLET | Freq: Every day | ORAL | 3 refills | Status: DC

## 2020-08-26 NOTE — Progress Notes (Signed)
Established patient visit   Patient: Mckenzie Willis   DOB: 1950-10-08   70 y.o. Female  MRN: [id number] Visit Date: 08/26/2020  Today's healthcare provider: Lavon Paganini, MD   Chief Complaint  Patient presents with  . Hypertension   Subjective    HPI  Hypertension, follow-up  BP Readings from Last 3 Encounters:  08/26/20 (!) 142/76  07/22/20 122/66  05/29/20 (!) 152/77   Wt Readings from Last 3 Encounters:  08/26/20 160 lb (72.6 kg)  05/29/20 158 lb (71.7 kg)  01/11/20 158 lb (71.7 kg)     She was last seen for hypertension 3 months ago.  BP at that visit was 152/77. Management since that visit includes continue Losartan 100mg  and HTCZ 25mg  daily, patient advised that we may need to add Amlodipine if blood pressure continues to be elevated.  She reports good compliance with treatment. She is not having side effects.  She is following a Low Sodium diet. She is not exercising. She does not smoke.  Use of agents associated with hypertension: none.   Outside blood pressures are fluctuating. Symptoms: No chest pain No chest pressure  No palpitations No syncope  No dyspnea No orthopnea  No paroxysmal nocturnal dyspnea No lower extremity edema   Pertinent labs: Lab Results  Component Value Date   CHOL 196 05/29/2020   HDL 75 05/29/2020   LDLCALC 106 (H) 05/29/2020   TRIG 85 05/29/2020   CHOLHDL 2.6 05/29/2020   Lab Results  Component Value Date   NA 141 05/29/2020   K 4.4 05/29/2020   CREATININE 0.74 05/29/2020   GFRNONAA 84 05/29/2020   GFRAA 97 05/29/2020   GLUCOSE 97 05/29/2020     The 10-year ASCVD risk score Mikey Bussing DC Jr., et al., 2013) is: 10.1%   ---------------------------------------------------------------------------------------------------   Patient Active Problem List   Diagnosis Date Noted  . 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 Chronic pain of right knee 08/11/2019  . LLQ pain 08/11/2019  . Overweight 08/11/2019  .  Family history of breast cancer 08/11/2019  . Family history of colonic polyps 08/11/2019  . Hypothyroidism 03/24/2015  . Thyroid nodule 03/24/2015  . Lichen sclerosus [id number]  . Benign essential HTN 10/04/2013  . HLD (hyperlipidemia) 10/04/2013   Social History   Tobacco Use  . Smoking status: Former Smoker    Packs/day: 0.75    Years: 10.00    Pack years: 7.50    Types: Cigarettes    Quit date: 1983    Years since quitting: 39.1  . Smokeless tobacco: Never Used  Vaping Use  . Vaping Use: Never used  Substance Use Topics  . Alcohol use: Yes    Alcohol/week: 1.0 - 4.0 standard drink    Types: 1 - 4 Glasses of wine per week  . Drug use: No   No Known Allergies     Medications: Outpatient Medications Prior to Visit  Medication Sig  . Cholecalciferol (VITAMIN D-3) 25 MCG (1000 UT) CAPS Take 1 capsule by mouth daily.  . diclofenac Sodium (VOLTAREN) 1 % GEL Apply topically 4 (four) times daily.  . hydrochlorothiazide (HYDRODIURIL) 25 MG tablet Take 1 tablet (25 mg total) by mouth daily.  Marland Kitchen losartan (COZAAR) 100 MG tablet Take 1 tablet (100 mg total) by mouth daily.  . rosuvastatin (CRESTOR) 10 MG tablet TAKE 1 TABLET BY MOUTH EVERY DAY  . vitamin B-12 (CYANOCOBALAMIN) 1000 MCG tablet Take 1,000 mcg by mouth daily.   No facility-administered medications  prior to visit.    Review of Systems  Constitutional: Negative for appetite change, fatigue and fever.  Respiratory: Negative for cough and chest tightness.   Cardiovascular: Negative for chest pain and palpitations.    Last CBC Lab Results  Component Value Date   WBC 4.4 10/16/2018   HGB 12.9 10/16/2018   HCT 38 10/16/2018   PLT 312 57/32/2025   Last metabolic panel Lab Results  Component Value Date   GLUCOSE 97 05/29/2020   NA 141 05/29/2020   K 4.4 05/29/2020   CL 101 05/29/2020   CO2 26 05/29/2020   BUN 13 05/29/2020   CREATININE 0.74 05/29/2020   GFRNONAA 84 05/29/2020   GFRAA 97 05/29/2020    CALCIUM 10.1 05/29/2020   PROT 7.4 05/29/2020   ALBUMIN 4.7 05/29/2020   LABGLOB 2.7 05/29/2020   AGRATIO 1.7 05/29/2020   BILITOT 0.6 05/29/2020   ALKPHOS 74 05/29/2020   AST 16 05/29/2020   ALT 10 05/29/2020   Last lipids Lab Results  Component Value Date   CHOL 196 05/29/2020   HDL 75 05/29/2020   LDLCALC 106 (H) 05/29/2020   TRIG 85 05/29/2020   CHOLHDL 2.6 05/29/2020        Objective    BP (!) 142/76 (BP Location: Left Arm, Patient Position: Sitting, Cuff Size: Large)   Pulse 72   Temp 98.3 F (36.8 C) (Oral)   Resp 16   Ht 5\' 5"  (1.651 m)   Wt 160 lb (72.6 kg)   SpO2 99%   BMI 26.63 kg/m  BP Readings from Last 3 Encounters:  08/26/20 (!) 142/76  07/22/20 122/66  05/29/20 (!) 152/77   Wt Readings from Last 3 Encounters:  08/26/20 160 lb (72.6 kg)  05/29/20 158 lb (71.7 kg)  01/11/20 158 lb (71.7 kg)      Physical Exam Vitals reviewed.  Constitutional:      General: She is not in acute distress.    Appearance: Normal appearance. She is well-developed. She is not diaphoretic.  HENT:     Head: Normocephalic and atraumatic.  Eyes:     General: No scleral icterus.    Conjunctiva/sclera: Conjunctivae normal.  Neck:     Thyroid: No thyromegaly.  Cardiovascular:     Rate and Rhythm: Normal rate and regular rhythm.     Pulses: Normal pulses.     Heart sounds: Normal heart sounds. No murmur heard.   Pulmonary:     Effort: Pulmonary effort is normal. No respiratory distress.     Breath sounds: Normal breath sounds. No wheezing, rhonchi or rales.  Musculoskeletal:     Cervical back: Neck supple.     Right lower leg: No edema.     Left lower leg: No edema.  Lymphadenopathy:     Cervical: No cervical adenopathy.  Skin:    General: Skin is warm and dry.     Findings: No rash.  Neurological:     Mental Status: She is alert and oriented to person, place, and time. Mental status is at baseline.  Psychiatric:        Mood and Affect: Mood normal.         Behavior: Behavior normal.       No results found for any visits on 08/26/20.  Assessment & Plan     Problem List Items Addressed This Visit      Cardiovascular and Mediastinum   Benign essential HTN - Primary    Remains elevated and uncontrolled Discussed diet  contributions to lowering blood pressure--Dash diet Continue losartan 100 mg and HCTZ 25 mg daily Reviewed recent labs Add amlodipine 5 mg daily Follow-up at CPE in about 2 months and repeat labs at that time      Relevant Medications   amLODipine (NORVASC) 5 MG tablet       Return in about 2 months (around 10/24/2020) for CPE, AWV, as scheduled.      I, Lavon Paganini, MD, have reviewed all documentation for this visit. The documentation on 08/26/20 for the exam, diagnosis, procedures, and orders are all accurate and complete.   Chi Garlow, Dionne Bucy, MD, MPH [facility]

## 2020-08-26 NOTE — Assessment & Plan Note (Signed)
Remains elevated and uncontrolled Discussed diet contributions to lowering blood pressure--Dash diet Continue losartan 100 mg and HCTZ 25 mg daily Reviewed recent labs Add amlodipine 5 mg daily Follow-up at CPE in about 2 months and repeat labs at that time

## 2020-08-26 NOTE — Patient Instructions (Signed)

## 2020-09-24 DIAGNOSIS — L2089 Other atopic dermatitis: Secondary | ICD-10-CM | POA: Diagnosis not present

## 2020-09-24 DIAGNOSIS — B86 Scabies: Secondary | ICD-10-CM | POA: Diagnosis not present

## 2020-09-30 ENCOUNTER — Ambulatory Visit: Payer: PPO | Admitting: Family Medicine

## 2020-10-31 ENCOUNTER — Other Ambulatory Visit: Payer: Self-pay | Admitting: Family Medicine

## 2020-10-31 NOTE — Telephone Encounter (Signed)
Requested Prescriptions  Pending Prescriptions Disp Refills  . rosuvastatin (CRESTOR) 10 MG tablet [Pharmacy Med Name: ROSUVASTATIN CALCIUM 10 MG TAB] 90 tablet 0    Sig: TAKE 1 TABLET BY MOUTH EVERY DAY     Cardiovascular:  Antilipid - Statins Failed - 10/31/2020  1:17 AM      Failed - LDL in normal range and within 360 days    LDL Chol Calc (NIH)  Date Value Ref Range Status  05/29/2020 106 (H) 0 - 99 mg/dL Final         Passed - Total Cholesterol in normal range and within 360 days    Cholesterol, Total  Date Value Ref Range Status  05/29/2020 196 100 - 199 mg/dL Final         Passed - HDL in normal range and within 360 days    HDL  Date Value Ref Range Status  05/29/2020 75 >39 mg/dL Final         Passed - Triglycerides in normal range and within 360 days    Triglycerides  Date Value Ref Range Status  05/29/2020 85 0 - 149 mg/dL Final         Passed - Patient is not pregnant      Passed - Valid encounter within last 12 months    Recent Outpatient Visits          2 months ago Benign essential HTN   [place], Dionne Bucy, MD   5 months ago Benign essential HTN   [facility] [place], Dionne Bucy, MD   11 months ago [facility] to [employer] preventive visit   [facility] [place], Dionne Bucy, MD   1 year ago Chronic right SI joint pain   [facility] [place], Dionne Bucy, MD

## 2020-11-10 ENCOUNTER — Ambulatory Visit: Payer: PPO | Admitting: Family Medicine

## 2020-11-10 ENCOUNTER — Ambulatory Visit: Payer: PPO

## 2020-11-11 DIAGNOSIS — L508 Other urticaria: Secondary | ICD-10-CM | POA: Diagnosis not present

## 2020-11-11 DIAGNOSIS — L2089 Other atopic dermatitis: Secondary | ICD-10-CM | POA: Diagnosis not present

## 2020-11-18 ENCOUNTER — Ambulatory Visit: Payer: PPO | Admitting: Family Medicine

## 2020-11-18 ENCOUNTER — Ambulatory Visit: Payer: PPO

## 2020-11-19 ENCOUNTER — Ambulatory Visit (INDEPENDENT_AMBULATORY_CARE_PROVIDER_SITE_OTHER): Payer: PPO | Admitting: Family Medicine

## 2020-11-19 ENCOUNTER — Other Ambulatory Visit: Payer: Self-pay

## 2020-11-19 ENCOUNTER — Encounter: Payer: Self-pay | Admitting: Family Medicine

## 2020-11-19 VITALS — BP 109/70 | HR 69 | Temp 98.0°F | Ht 65.0 in | Wt 158.0 lb

## 2020-11-19 DIAGNOSIS — E041 Nontoxic single thyroid nodule: Secondary | ICD-10-CM

## 2020-11-19 DIAGNOSIS — I10 Essential (primary) hypertension: Secondary | ICD-10-CM

## 2020-11-19 DIAGNOSIS — E782 Mixed hyperlipidemia: Secondary | ICD-10-CM | POA: Diagnosis not present

## 2020-11-19 DIAGNOSIS — E538 Deficiency of other specified B group vitamins: Secondary | ICD-10-CM

## 2020-11-19 DIAGNOSIS — Z0000 Encounter for general adult medical examination without abnormal findings: Secondary | ICD-10-CM

## 2020-11-19 DIAGNOSIS — E559 Vitamin D deficiency, unspecified: Secondary | ICD-10-CM

## 2020-11-19 MED ORDER — HYDROCHLOROTHIAZIDE 25 MG PO TABS: 25.0000 mg | ORAL_TABLET | Freq: Every day | ORAL | 1 refills | Status: DC

## 2020-11-19 MED ORDER — ROSUVASTATIN CALCIUM 10 MG PO TABS: 10.0000 mg | ORAL_TABLET | Freq: Every day | ORAL | 1 refills | Status: DC

## 2020-11-19 MED ORDER — LOSARTAN POTASSIUM 100 MG PO TABS
100.0000 mg | ORAL_TABLET | Freq: Every day | ORAL | 1 refills | Status: DC
Start: 2020-11-19 — End: 2021-08-13

## 2020-11-19 MED ORDER — AMLODIPINE BESYLATE 5 MG PO TABS: 5.0000 mg | ORAL_TABLET | Freq: Every day | ORAL | 3 refills | Status: DC

## 2020-11-19 NOTE — Patient Instructions (Signed)
Health Maintenance After Age 68 After age 68, you are at a higher risk for certain long-term diseases and infections as well as injuries from falls. Falls are a major cause of broken bones and head injuries in people who are older than age 68. Getting regular preventive care can help to keep you healthy and well. Preventive care includes getting regular testing and making lifestyle changes as recommended by your health care provider. Talk with your health care provider about:  Which screenings and tests you should have. A screening is a test that checks for a disease when you have no symptoms.  A diet and exercise plan that is right for you. What should I know about screenings and tests to prevent falls? Screening and testing are the best ways to find a health problem early. Early diagnosis and treatment give you the best chance of managing medical conditions that are common after age 68. Certain conditions and lifestyle choices may make you more likely to have a fall. Your health care provider may recommend:  Regular vision checks. Poor vision and conditions such as cataracts can make you more likely to have a fall. If you wear glasses, make sure to get your prescription updated if your vision changes.  Medicine review. Work with your health care provider to regularly review all of the medicines you are taking, including over-the-counter medicines. Ask your health care provider about any side effects that may make you more likely to have a fall. Tell your health care provider if any medicines that you take make you feel dizzy or sleepy.  Osteoporosis screening. Osteoporosis is a condition that causes the bones to get weaker. This can make the bones weak and cause them to break more easily.  Blood pressure screening. Blood pressure changes and medicines to control blood pressure can make you feel dizzy.  Strength and balance checks. Your health care provider may recommend certain tests to check your  strength and balance while standing, walking, or changing positions.  Foot health exam. Foot pain and numbness, as well as not wearing proper footwear, can make you more likely to have a fall.  Depression screening. You may be more likely to have a fall if you have a fear of falling, feel emotionally low, or feel unable to do activities that you used to do.  Alcohol use screening. Using too much alcohol can affect your balance and may make you more likely to have a fall. What actions can I take to lower my risk of falls? General instructions  Talk with your health care provider about your risks for falling. Tell your health care provider if: ? You fall. Be sure to tell your health care provider about all falls, even ones that seem minor. ? You feel dizzy, sleepy, or off-balance.  Take over-the-counter and prescription medicines only as told by your health care provider. These include any supplements.  Eat a healthy diet and maintain a healthy weight. A healthy diet includes low-fat dairy products, low-fat (lean) meats, and fiber from whole grains, beans, and lots of fruits and vegetables. Home safety  Remove any tripping hazards, such as rugs, cords, and clutter.  Install safety equipment such as grab bars in bathrooms and safety rails on stairs.  Keep rooms and walkways well-lit. Activity  Follow a regular exercise program to stay fit. This will help you maintain your balance. Ask your health care provider what types of exercise are appropriate for you.  If you need a cane or walker,   use it as recommended by your health care provider.  Wear supportive shoes that have nonskid soles.   Lifestyle  Do not drink alcohol if your health care provider tells you not to drink.  If you drink alcohol, limit how much you have: ? 0-1 drink a day for women. ? 0-2 drinks a day for men.  Be aware of how much alcohol is in your drink. In the [place] one drink equals one typical bottle of beer (12  oz), one-half glass of wine (5 oz), or one shot of hard liquor (1 oz).  Do not use any products that contain nicotine or tobacco, such as cigarettes and e-cigarettes. If you need help quitting, ask your health care provider. Summary  Having a healthy lifestyle and getting preventive care can help to protect your health and wellness after age 68.  Screening and testing are the best way to find a health problem early and help you avoid having a fall. Early diagnosis and treatment give you the best chance for managing medical conditions that are more common for people who are older than age 68.  Falls are a major cause of broken bones and head injuries in people who are older than age 68. Take precautions to prevent a fall at home.  Work with your health care provider to learn what changes you can make to improve your health and wellness and to prevent falls. This information is not intended to replace advice given to you by your health care provider. Make sure you discuss any questions you have with your health care provider. Document Revised: 11/02/2018 Document Reviewed: 05/25/2017 Elsevier Patient Education  2021 Elsevier Inc.  

## 2020-11-19 NOTE — Progress Notes (Addendum)
Annual Wellness Visit     Patient: Mckenzie Willis, Female    DOB: 1951/01/28, 70 y.o.   MRN: [id number] Visit Date: 11/19/2020  Today's Provider: Laurita Quint Rishita Petron, FNP   Chief Complaint  Patient presents with  . Annual Exam  . Back Pain  . Hypertension   Subjective    Mckenzie Willis is a 70 y.o. female who presents today for her Annual Wellness Visit. She reports consuming a general diet.  She generally feels well. She reports sleeping well. She does have additional problems to discuss today.   HPI   Low back pain, hip and knee pain. (Right Side) Hypertension follow up  BP Readings from Last 3 Encounters:  11/19/20 109/70  08/26/20 (!) 142/76  07/22/20 122/66   Only seeing Dermatology for atopic dermatitis Denies any other specialists Denies recent Ed, UC, or surgeries  Sees eye doctor yearly Sees dentist twice a year   Patient Active Problem List   Diagnosis Date Noted  . 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 Chronic pain of right knee 08/11/2019  . LLQ pain 08/11/2019  . Overweight 08/11/2019  . Family history of breast cancer 08/11/2019  . Family history of colonic polyps 08/11/2019  . Hypothyroidism 03/24/2015  . Thyroid nodule 03/24/2015  . Lichen sclerosus 34/19/3790  . Benign essential HTN 10/04/2013  . HLD (hyperlipidemia) 10/04/2013   Past Medical History:  Diagnosis Date  . Arthritis   . Back pain, chronic   . History of chicken pox   . History of measles   . History of mumps   . Hyperlipidemia   . Hypertension   . Plantar fasciitis    Social History   Tobacco Use  . Smoking status: Former Smoker    Packs/day: 0.75    Years: 10.00    Pack years: 7.50    Types: Cigarettes    Quit date: 1983    Years since quitting: 39.3  . Smokeless tobacco: Never Used  Vaping Use  . Vaping Use: Never used  Substance Use Topics  . Alcohol use: Yes    Alcohol/week: 1.0 - 4.0 standard drink    Types: 1 - 4 Glasses of wine per week   . Drug use: No   No Known Allergies   Medications: Outpatient Medications Prior to Visit  Medication Sig  . Cholecalciferol (VITAMIN D-3) 25 MCG (1000 UT) CAPS Take 1 capsule by mouth daily.  . diclofenac Sodium (VOLTAREN) 1 % GEL Apply topically 4 (four) times daily.  . vitamin B-12 (CYANOCOBALAMIN) 1000 MCG tablet Take 1,000 mcg by mouth daily.  . [DISCONTINUED] amLODipine (NORVASC) 5 MG tablet Take 1 tablet (5 mg total) by mouth daily.  . [DISCONTINUED] hydrochlorothiazide (HYDRODIURIL) 25 MG tablet Take 1 tablet (25 mg total) by mouth daily.  . [DISCONTINUED] losartan (COZAAR) 100 MG tablet Take 1 tablet (100 mg total) by mouth daily.  . [DISCONTINUED] rosuvastatin (CRESTOR) 10 MG tablet TAKE 1 TABLET BY MOUTH EVERY DAY   No facility-administered medications prior to visit.    No Known Allergies  Patient Care Team: Virginia Crews, MD as PCP - General (Family Medicine)  Review of Systems  Constitutional: Negative.   HENT: Negative.   Eyes: Negative.   Respiratory: Negative.   Cardiovascular: Negative.   Endocrine: Negative.   Genitourinary: Negative.   Musculoskeletal: Negative.   Skin: Positive for rash. Negative for color change, pallor and wound.  Allergic/Immunologic: Negative.   Neurological: Negative.   Hematological:  Negative.   Psychiatric/Behavioral: Negative.         Objective    Vitals: BP 109/70 (BP Location: Left Arm, Patient Position: Sitting, Cuff Size: Large)   Pulse 69   Temp 98 F (36.7 C) (Oral)   Ht 5\' 5"  (1.651 m)   Wt 158 lb (71.7 kg)   BMI 26.29 kg/m    Physical Exam Vitals reviewed.  Constitutional:      Appearance: Normal appearance.  HENT:     Right Ear: Tympanic membrane and ear canal normal.     Left Ear: Tympanic membrane and ear canal normal.     Nose: Nose normal.  Eyes:     Extraocular Movements: Extraocular movements intact.     Pupils: Pupils are equal, round, and reactive to light.  Neck:     Thyroid: No  thyroid mass, thyromegaly or thyroid tenderness.  Cardiovascular:     Rate and Rhythm: Normal rate and regular rhythm.     Pulses: Normal pulses.     Heart sounds: Normal heart sounds.  Pulmonary:     Effort: Pulmonary effort is normal.     Breath sounds: Normal breath sounds.  Abdominal:     General: Bowel sounds are normal.     Palpations: Abdomen is soft.  Musculoskeletal:        General: Normal range of motion.     Cervical back: Normal range of motion.     Thoracic back: Normal.     Lumbar back: Normal.     Right knee: Normal.     Left knee: Normal.  Lymphadenopathy:     Cervical: No cervical adenopathy.  Skin:    General: Skin is warm and dry.     Capillary Refill: Capillary refill takes less than 2 seconds.  Neurological:     General: No focal deficit present.     Mental Status: She is alert and oriented to person, place, and time.  Psychiatric:        Mood and Affect: Mood normal.        Behavior: Behavior normal.      Most recent functional status assessment: In your present state of health, do you have any difficulty performing the following activities: 11/19/2020  Hearing? N  Vision? N  Difficulty concentrating or making decisions? N  Walking or climbing stairs? Y  Dressing or bathing? N  Doing errands, shopping? N  Some recent data might be hidden   Most recent fall risk assessment: Fall Risk  11/19/2020  Falls in the past year? 0  Number falls in past yr: 0  Injury with Fall? 0  Risk for fall due to : No Fall Risks  Follow up Falls evaluation completed    Most recent depression screenings: PHQ 2/9 Scores 11/19/2020 05/29/2020  PHQ - 2 Score 0 0  PHQ- 9 Score 0 0   Most recent cognitive screening: 6CIT Screen 11/08/2019  What Year? 0 points  What month? 0 points  What time? 0 points  Count back from 20 0 points  Months in reverse 2 points  Repeat phrase 0 points  Total Score 2   Most recent Audit-C alcohol use screening Alcohol Use Disorder  Test (AUDIT) 11/19/2020  1. How often do you have a drink containing alcohol? 2  2. How many drinks containing alcohol do you have on a typical day when you are drinking? 0  3. How often do you have six or more drinks on one occasion? 0  AUDIT-C Score 2  A score of 3 or more in women, and 4 or more in men indicates increased risk for alcohol abuse, EXCEPT if all of the points are from question 1   No results found for any visits on 11/19/20.  Assessment & Plan     Annual wellness visit done today including the all of the following: Reviewed patient's Family Medical History Reviewed and updated list of patient's medical providers Assessment of cognitive impairment was done Assessed patient's functional ability Established a written schedule for health screening [place] Completed and Reviewed  Exercise Activities and Dietary recommendations Goals   None     Immunization History  Administered Date(s) Administered  . Influenza-Unspecified 06/26/2015    Health Maintenance  Topic Date Due  . COVID-19 Vaccine (1) Never done  . TETANUS/TDAP  Never done  . PNA vac Low Risk Adult (1 of 2 - PCV13) Never done  . INFLUENZA VACCINE  02/23/2021  . MAMMOGRAM  12/31/2021  . COLONOSCOPY (Pts 45-70yrs Insurance coverage will need to be confirmed)  01/10/2025  . DEXA SCAN  Completed  . Hepatitis C Screening  Completed  . HPV VACCINES  Aged Out     Discussed health benefits of physical activity, and encouraged her to engage in regular exercise appropriate for her age and condition.  Problem List Items Addressed This Visit      Cardiovascular and Mediastinum   Benign essential HTN - Primary   Relevant Medications   amLODipine (NORVASC) 5 MG tablet   hydrochlorothiazide (HYDRODIURIL) 25 MG tablet   losartan (COZAAR) 100 MG tablet   rosuvastatin (CRESTOR) 10 MG tablet   Other Relevant Orders   Comprehensive metabolic panel     Endocrine   Thyroid nodule    Relevant Orders   TSH     Other   HLD (hyperlipidemia)   Relevant Medications   amLODipine (NORVASC) 5 MG tablet   hydrochlorothiazide (HYDRODIURIL) 25 MG tablet   losartan (COZAAR) 100 MG tablet   rosuvastatin (CRESTOR) 10 MG tablet   Other Relevant Orders   Lipid Panel    Other Visit Diagnoses    Annual physical exam       Relevant Orders   CBC with Differential   Vitamin D deficiency       Relevant Orders   Vitamin D, 25-hydroxy   Vitamin B 12 deficiency       Relevant Orders   Vitamin B12       Declines pneumonia, tdap, covid and shingles vaccine Discussed knee pain, declines ortho and PT right now. Will work on stretches at home. If she changes her mind she will call for a referral  Return in about 6 months (around 05/21/2021).      [place], [place] [phone] (phone) [phone] (fax)  [place]

## 2020-11-27 DIAGNOSIS — E041 Nontoxic single thyroid nodule: Secondary | ICD-10-CM | POA: Diagnosis not present

## 2020-11-27 DIAGNOSIS — I10 Essential (primary) hypertension: Secondary | ICD-10-CM | POA: Diagnosis not present

## 2020-11-27 DIAGNOSIS — E782 Mixed hyperlipidemia: Secondary | ICD-10-CM | POA: Diagnosis not present

## 2020-11-27 DIAGNOSIS — Z0000 Encounter for general adult medical examination without abnormal findings: Secondary | ICD-10-CM | POA: Diagnosis not present

## 2020-11-27 DIAGNOSIS — E559 Vitamin D deficiency, unspecified: Secondary | ICD-10-CM | POA: Diagnosis not present

## 2020-11-27 DIAGNOSIS — E538 Deficiency of other specified B group vitamins: Secondary | ICD-10-CM | POA: Diagnosis not present

## 2020-11-28 LAB — COMPREHENSIVE METABOLIC PANEL
ALT: 9 IU/L (ref 0–32)
AST: 16 IU/L (ref 0–40)
Albumin/Globulin Ratio: 2.1 (ref 1.2–2.2)
Albumin: 4.8 g/dL (ref 3.8–4.8)
Alkaline Phosphatase: 69 IU/L (ref 44–121)
BUN/Creatinine Ratio: 26 (ref 12–28)
BUN: 22 mg/dL (ref 8–27)
Bilirubin Total: 0.5 mg/dL (ref 0.0–1.2)
CO2: 24 mmol/L (ref 20–29)
Calcium: 9.7 mg/dL (ref 8.7–10.3)
Chloride: 100 mmol/L (ref 96–106)
Creatinine, Ser: 0.86 mg/dL (ref 0.57–1.00)
Globulin, Total: 2.3 g/dL (ref 1.5–4.5)
Glucose: 107 mg/dL — ABNORMAL HIGH (ref 65–99)
Potassium: 4.2 mmol/L (ref 3.5–5.2)
Sodium: 139 mmol/L (ref 134–144)
Total Protein: 7.1 g/dL (ref 6.0–8.5)
eGFR: 74 mL/min/{1.73_m2} (ref >59)

## 2020-11-28 LAB — LIPID PANEL
Chol/HDL Ratio: 2.8 ratio (ref 0.0–4.4)
Cholesterol, Total: 195 mg/dL (ref 100–199)
HDL: 70 mg/dL (ref >39)
LDL Chol Calc (NIH): 111 mg/dL — ABNORMAL HIGH (ref 0–99)
Triglycerides: 80 mg/dL (ref 0–149)
VLDL Cholesterol Cal: 14 mg/dL (ref 5–40)

## 2020-11-28 LAB — CBC WITH DIFFERENTIAL/PLATELET
Basophils Absolute: 0 10*3/uL (ref 0.0–0.2)
Basos: 1 %
EOS (ABSOLUTE): 0.1 10*3/uL (ref 0.0–0.4)
Eos: 3 %
Hematocrit: 38.4 % (ref 34.0–46.6)
Hemoglobin: 13 g/dL (ref 11.1–15.9)
Immature Grans (Abs): 0 10*3/uL (ref 0.0–0.1)
Immature Granulocytes: 0 %
Lymphocytes Absolute: 1.7 10*3/uL (ref 0.7–3.1)
Lymphs: 34 %
MCH: 29.9 pg (ref 26.6–33.0)
MCHC: 33.9 g/dL (ref 31.5–35.7)
MCV: 88 fL (ref 79–97)
Monocytes Absolute: 0.4 10*3/uL (ref 0.1–0.9)
Monocytes: 7 %
Neutrophils Absolute: 2.9 10*3/uL (ref 1.4–7.0)
Neutrophils: 55 %
Platelets: 241 10*3/uL (ref 150–450)
RBC: 4.35 x10E6/uL (ref 3.77–5.28)
RDW: 13 % (ref 11.7–15.4)
WBC: 5.1 10*3/uL (ref 3.4–10.8)

## 2020-11-28 LAB — TSH: TSH: 2.44 u[IU]/mL (ref 0.450–4.500)

## 2020-11-28 LAB — VITAMIN D 25 HYDROXY (VIT D DEFICIENCY, FRACTURES): Vit D, 25-Hydroxy: 46.9 ng/mL (ref 30.0–100.0)

## 2020-11-28 LAB — VITAMIN B12: Vitamin B-12: 811 pg/mL (ref 232–1245)

## 2020-12-18 ENCOUNTER — Other Ambulatory Visit: Payer: Self-pay | Admitting: Family Medicine

## 2020-12-18 DIAGNOSIS — Z1231 Encounter for screening mammogram for malignant neoplasm of breast: Secondary | ICD-10-CM

## 2021-01-01 ENCOUNTER — Ambulatory Visit
Admission: RE | Admit: 2021-01-01 | Discharge: 2021-01-01 | Disposition: A | Discharge status: Home / Self Care | Payer: PPO | Source: Ambulatory Visit | Attending: Family Medicine | Admitting: Family Medicine

## 2021-01-01 ENCOUNTER — Other Ambulatory Visit: Payer: Self-pay

## 2021-01-01 DIAGNOSIS — Z1231 Encounter for screening mammogram for malignant neoplasm of breast: Secondary | ICD-10-CM | POA: Diagnosis not present

## 2021-01-02 DIAGNOSIS — H02831 Dermatochalasis of right upper eyelid: Secondary | ICD-10-CM | POA: Diagnosis not present

## 2021-02-26 DIAGNOSIS — L574 Cutis laxa senilis: Secondary | ICD-10-CM | POA: Diagnosis not present

## 2021-04-29 DIAGNOSIS — D2330 Other benign neoplasm of skin of unspecified part of face: Secondary | ICD-10-CM | POA: Diagnosis not present

## 2021-04-29 DIAGNOSIS — L508 Other urticaria: Secondary | ICD-10-CM | POA: Diagnosis not present

## 2021-04-29 DIAGNOSIS — L2089 Other atopic dermatitis: Secondary | ICD-10-CM | POA: Diagnosis not present

## 2021-04-29 DIAGNOSIS — L738 Other specified follicular disorders: Secondary | ICD-10-CM | POA: Diagnosis not present

## 2021-04-29 DIAGNOSIS — L720 Epidermal cyst: Secondary | ICD-10-CM | POA: Diagnosis not present

## 2021-05-26 ENCOUNTER — Ambulatory Visit (INDEPENDENT_AMBULATORY_CARE_PROVIDER_SITE_OTHER): Payer: PPO | Admitting: Family Medicine

## 2021-05-26 ENCOUNTER — Encounter: Payer: Self-pay | Admitting: Family Medicine

## 2021-05-26 ENCOUNTER — Other Ambulatory Visit: Payer: Self-pay

## 2021-05-26 VITALS — BP 130/60 | HR 69 | Temp 97.7°F | Resp 16 | Wt 161.7 lb

## 2021-05-26 DIAGNOSIS — E782 Mixed hyperlipidemia: Secondary | ICD-10-CM

## 2021-05-26 DIAGNOSIS — R739 Hyperglycemia, unspecified: Secondary | ICD-10-CM

## 2021-05-26 DIAGNOSIS — E041 Nontoxic single thyroid nodule: Secondary | ICD-10-CM | POA: Diagnosis not present

## 2021-05-26 DIAGNOSIS — E663 Overweight: Secondary | ICD-10-CM

## 2021-05-26 DIAGNOSIS — I10 Essential (primary) hypertension: Secondary | ICD-10-CM | POA: Diagnosis not present

## 2021-05-26 MED ORDER — ROSUVASTATIN CALCIUM 10 MG PO TABS: 10.0000 mg | ORAL_TABLET | ORAL | 1 refills | Status: DC

## 2021-05-26 NOTE — Progress Notes (Signed)
Established patient visit   Patient: Mckenzie Willis   DOB: February 07, 1951   70 y.o. Female  MRN: [id number] Visit Date: 05/26/2021  Today's healthcare provider: Lavon Paganini, MD   Chief Complaint  Patient presents with   Hypertension   Hyperlipidemia    Subjective    HPI  Hypertension, follow-up  BP Readings from Last 3 Encounters:  05/26/21 130/60  11/19/20 109/70  08/26/20 (!) 142/76   Wt Readings from Last 3 Encounters:  05/26/21 161 lb 11.2 oz (73.3 kg)  11/19/20 158 lb (71.7 kg)  08/26/20 160 lb (72.6 kg)     She was last seen for hypertension 6 months ago.  BP at that visit was 109/70. Management since that visit includes continue current medications.  She reports excellent compliance with treatment. She is not having side effects.  She is following a Regular diet. She is not exercising. She does not smoke.   Outside blood pressures are not being checked. Symptoms: No chest pain No chest pressure  No palpitations No syncope  No dyspnea No orthopnea  No paroxysmal nocturnal dyspnea No lower extremity edema   Pertinent labs: Lab Results  Component Value Date   CHOL 195 11/27/2020   HDL 70 11/27/2020   LDLCALC 111 (H) 11/27/2020   TRIG 80 11/27/2020   CHOLHDL 2.8 11/27/2020   Lab Results  Component Value Date   NA 139 11/27/2020   K 4.2 11/27/2020   CREATININE 0.86 11/27/2020   EGFR 74 11/27/2020   GLUCOSE 107 (H) 11/27/2020   TSH 2.440 11/27/2020     The 10-year ASCVD risk score (Arnett DK, et al., 2019) is: 9.8%   ---------------------------------------------------------------------------------------------------  Lipid/Cholesterol, Follow-up  Last lipid panel Other pertinent labs  Lab Results  Component Value Date   CHOL 195 11/27/2020   HDL 70 11/27/2020   LDLCALC 111 (H) 11/27/2020   TRIG 80 11/27/2020   CHOLHDL 2.8 11/27/2020   Lab Results  Component Value Date   ALT 9 11/27/2020   AST 16 11/27/2020   PLT 241  11/27/2020   TSH 2.440 11/27/2020     She was last seen for this 6 months ago.  Management since that visit includes continue current medications.  She reports excellent compliance with treatment. She is not having side effects.   Symptoms: No chest pain No chest pressure/discomfort  No dyspnea No lower extremity edema  No numbness or tingling of extremity No orthopnea  No palpitations No paroxysmal nocturnal dyspnea  No speech difficulty No syncope   Current diet: in general, a "healthy" diet   Current exercise: walking  The 10-year ASCVD risk score (Arnett DK, et al., 2019) is: 9.8%  ---------------------------------------------------------------------------------------------------     Medications: Outpatient Medications Prior to Visit  Medication Sig   amLODipine (NORVASC) 5 MG tablet Take 1 tablet (5 mg total) by mouth daily.   Cholecalciferol (VITAMIN D-3) 25 MCG (1000 UT) CAPS Take 1 capsule by mouth daily.   diclofenac Sodium (VOLTAREN) 1 % GEL Apply topically 4 (four) times daily.   hydrochlorothiazide (HYDRODIURIL) 25 MG tablet Take 1 tablet (25 mg total) by mouth daily.   losartan (COZAAR) 100 MG tablet Take 1 tablet (100 mg total) by mouth daily.   vitamin B-12 (CYANOCOBALAMIN) 1000 MCG tablet Take 1,000 mcg by mouth daily.   [DISCONTINUED] rosuvastatin (CRESTOR) 10 MG tablet Take 1 tablet (10 mg total) by mouth daily.   No facility-administered medications prior to visit.    Review of Systems  Constitutional: Negative.   Respiratory: Negative.    Cardiovascular: Negative.        Objective    BP 130/60 (BP Location: Left Arm, Patient Position: Sitting, Cuff Size: Large)   Pulse 69   Temp 97.7 F (36.5 C) (Temporal)   Resp 16   Wt 161 lb 11.2 oz (73.3 kg)   SpO2 99%   BMI 26.91 kg/m  BP Readings from Last 3 Encounters:  05/26/21 130/60  11/19/20 109/70  08/26/20 (!) 142/76   Wt Readings from Last 3 Encounters:  05/26/21 161 lb 11.2 oz (73.3 kg)   11/19/20 158 lb (71.7 kg)  08/26/20 160 lb (72.6 kg)      Physical Exam Vitals reviewed.  Constitutional:      General: She is not in acute distress.    Appearance: Normal appearance. She is well-developed. She is not diaphoretic.  HENT:     Head: Normocephalic and atraumatic.  Eyes:     General: No scleral icterus.    Conjunctiva/sclera: Conjunctivae normal.  Neck:     Thyroid: No thyromegaly.  Cardiovascular:     Rate and Rhythm: Normal rate and regular rhythm.     Pulses: Normal pulses.     Heart sounds: Normal heart sounds. No murmur heard. Pulmonary:     Effort: Pulmonary effort is normal. No respiratory distress.     Breath sounds: Normal breath sounds. No wheezing, rhonchi or rales.  Musculoskeletal:     Cervical back: Neck supple.     Right lower leg: No edema.     Left lower leg: No edema.  Lymphadenopathy:     Cervical: No cervical adenopathy.  Skin:    General: Skin is warm and dry.     Findings: No rash.  Neurological:     Mental Status: She is alert and oriented to person, place, and time. Mental status is at baseline.  Psychiatric:        Mood and Affect: Mood normal.        Behavior: Behavior normal.      No results found for any visits on 05/26/21.  Assessment & Plan     Problem List Items Addressed This Visit       Cardiovascular and Mediastinum   Benign essential HTN - Primary    Well controlled Continue current medications Recheck metabolic panel      Relevant Medications   rosuvastatin (CRESTOR) 10 MG tablet (Start on 05/27/2021)   Other Relevant Orders   Comprehensive Metabolic Panel (CMET)     Endocrine   Thyroid nodule    Thyroid nodule last [place] in 2013 Previous radioactive iodine uptake showed normal thyroid function Continue to monitor TSH      Relevant Orders   TSH     Other   HLD (hyperlipidemia)    Previously well controlled Continue statin - taking Crestor 3 times weekly Repeat FLP and CMP      Relevant  Medications   rosuvastatin (CRESTOR) 10 MG tablet (Start on 05/27/2021)   Other Relevant Orders   Comprehensive Metabolic Panel (CMET)   Lipid panel   Overweight    Discussed importance of healthy weight management Discussed diet and exercise       Other Visit Diagnoses     Hyperglycemia       Relevant Orders   Hemoglobin A1c        Return in about 6 months (around 11/23/2021) for AWV, CPE.      I, Lavon Paganini, MD, have reviewed  all documentation for this visit. The documentation on 05/26/21 for the exam, diagnosis, procedures, and orders are all accurate and complete.   Mckenzie Willis, Dionne Bucy, MD, MPH [facility]

## 2021-05-26 NOTE — Assessment & Plan Note (Addendum)
Previously well controlled Continue statin - taking Crestor 3 times weekly Repeat FLP and CMP

## 2021-05-26 NOTE — Assessment & Plan Note (Signed)
Discussed importance of healthy weight management Discussed diet and exercise  

## 2021-05-26 NOTE — Assessment & Plan Note (Signed)
Thyroid nodule last [place] in 2013 Previous radioactive iodine uptake showed normal thyroid function Continue to monitor TSH

## 2021-05-26 NOTE — Assessment & Plan Note (Signed)
Well controlled Continue current medications Recheck metabolic panel 

## 2021-05-27 LAB — COMPREHENSIVE METABOLIC PANEL
ALT: 19 IU/L (ref 0–32)
AST: 26 IU/L (ref 0–40)
Albumin/Globulin Ratio: 1.8 (ref 1.2–2.2)
Albumin: 4.8 g/dL (ref 3.8–4.8)
Alkaline Phosphatase: 80 IU/L (ref 44–121)
BUN/Creatinine Ratio: 18 (ref 12–28)
BUN: 14 mg/dL (ref 8–27)
Bilirubin Total: 0.7 mg/dL (ref 0.0–1.2)
CO2: 26 mmol/L (ref 20–29)
Calcium: 9.8 mg/dL (ref 8.7–10.3)
Chloride: 101 mmol/L (ref 96–106)
Creatinine, Ser: 0.76 mg/dL (ref 0.57–1.00)
Globulin, Total: 2.7 g/dL (ref 1.5–4.5)
Glucose: 93 mg/dL (ref 70–99)
Potassium: 4.2 mmol/L (ref 3.5–5.2)
Sodium: 140 mmol/L (ref 134–144)
Total Protein: 7.5 g/dL (ref 6.0–8.5)
eGFR: 85 mL/min/{1.73_m2} (ref >59)

## 2021-05-27 LAB — LIPID PANEL
Chol/HDL Ratio: 3 ratio (ref 0.0–4.4)
Cholesterol, Total: 208 mg/dL — ABNORMAL HIGH (ref 100–199)
HDL: 70 mg/dL (ref >39)
LDL Chol Calc (NIH): 118 mg/dL — ABNORMAL HIGH (ref 0–99)
Triglycerides: 114 mg/dL (ref 0–149)
VLDL Cholesterol Cal: 20 mg/dL (ref 5–40)

## 2021-05-27 LAB — HEMOGLOBIN A1C
Est. average glucose Bld gHb Est-mCnc: 114 mg/dL
Hgb A1c MFr Bld: 5.6 % (ref 4.8–5.6)

## 2021-05-27 LAB — TSH: TSH: 2.41 u[IU]/mL (ref 0.450–4.500)

## 2021-06-05 ENCOUNTER — Ambulatory Visit: Payer: PPO | Admitting: Family Medicine

## 2021-06-08 ENCOUNTER — Encounter: Payer: Self-pay | Admitting: Ophthalmology

## 2021-06-23 NOTE — Discharge Instructions (Signed)

## 2021-06-25 ENCOUNTER — Ambulatory Visit: Payer: PPO | Admitting: Anesthesiology

## 2021-06-25 ENCOUNTER — Other Ambulatory Visit: Payer: Self-pay

## 2021-06-25 ENCOUNTER — Ambulatory Visit
Admission: RE | Admit: 2021-06-25 | Discharge: 2021-06-25 | Disposition: A | Discharge status: Home / Self Care | Payer: PPO | Attending: Ophthalmology | Admitting: Ophthalmology

## 2021-06-25 ENCOUNTER — Encounter: Payer: Self-pay | Admitting: Ophthalmology

## 2021-06-25 ENCOUNTER — Encounter
Admission: RE | Disposition: A | Discharge status: Home / Self Care | Payer: Self-pay | Source: Home / Self Care | Attending: Ophthalmology

## 2021-06-25 DIAGNOSIS — I10 Essential (primary) hypertension: Secondary | ICD-10-CM | POA: Insufficient documentation

## 2021-06-25 DIAGNOSIS — H02834 Dermatochalasis of left upper eyelid: Secondary | ICD-10-CM | POA: Diagnosis not present

## 2021-06-25 DIAGNOSIS — H02831 Dermatochalasis of right upper eyelid: Secondary | ICD-10-CM | POA: Insufficient documentation

## 2021-06-25 DIAGNOSIS — Z87891 Personal history of nicotine dependence: Secondary | ICD-10-CM | POA: Diagnosis not present

## 2021-06-25 HISTORY — PX: BROW LIFT: SHX178

## 2021-06-25 SURGERY — BLEPHAROPLASTY
Anesthesia: General | Site: Eye | Laterality: Bilateral

## 2021-06-25 MED ORDER — LACTATED RINGERS IV SOLN: INTRAVENOUS | Status: DC

## 2021-06-25 MED ORDER — ACETAMINOPHEN 325 MG PO TABS
650.0000 mg | ORAL_TABLET | Freq: Four times a day (QID) | ORAL | Status: DC | PRN
  Administered 2021-06-25: 650 mg via ORAL

## 2021-06-25 MED ORDER — PROPOFOL 500 MG/50ML IV EMUL
INTRAVENOUS | Status: DC | PRN
Start: 2021-06-25 — End: 2021-06-25
  Administered 2021-06-25: 25 ug/kg/min via INTRAVENOUS

## 2021-06-25 MED ORDER — BSS IO SOLN
INTRAOCULAR | Status: DC | PRN
  Administered 2021-06-25: 15 mL

## 2021-06-25 MED ORDER — ERYTHROMYCIN 5 MG/GM OP OINT: TOPICAL_OINTMENT | OPHTHALMIC | 2 refills | Status: DC

## 2021-06-25 MED ORDER — OXYCODONE HCL 5 MG PO TABS: 5.0000 mg | ORAL_TABLET | Freq: Once | ORAL | Status: DC | PRN

## 2021-06-25 MED ORDER — LIDOCAINE HCL (CARDIAC) PF 100 MG/5ML IV SOSY
PREFILLED_SYRINGE | INTRAVENOUS | Status: DC | PRN
  Administered 2021-06-25: 40 mg via INTRAVENOUS

## 2021-06-25 MED ORDER — PROMETHAZINE HCL 25 MG/ML IJ SOLN: 6.2500 mg | INTRAMUSCULAR | Status: DC | PRN

## 2021-06-25 MED ORDER — FENTANYL CITRATE PF 50 MCG/ML IJ SOSY: 25.0000 ug | PREFILLED_SYRINGE | INTRAMUSCULAR | Status: DC | PRN

## 2021-06-25 MED ORDER — LIDOCAINE-EPINEPHRINE 2 %-1:100000 IJ SOLN
INTRAMUSCULAR | Status: DC | PRN
  Administered 2021-06-25: 2.5 mL via OPHTHALMIC
  Administered 2021-06-25: 3 mL via OPHTHALMIC

## 2021-06-25 MED ORDER — LABETALOL HCL 5 MG/ML IV SOLN
INTRAVENOUS | Status: DC | PRN
  Administered 2021-06-25: 5 mg via INTRAVENOUS

## 2021-06-25 MED ORDER — TETRACAINE HCL 0.5 % OP SOLN
OPHTHALMIC | Status: DC | PRN
  Administered 2021-06-25: 1 [drp] via OPHTHALMIC

## 2021-06-25 MED ORDER — MIDAZOLAM HCL 2 MG/2ML IJ SOLN
INTRAMUSCULAR | Status: DC | PRN
  Administered 2021-06-25: 2 mg via INTRAVENOUS

## 2021-06-25 MED ORDER — ERYTHROMYCIN 5 MG/GM OP OINT
TOPICAL_OINTMENT | OPHTHALMIC | Status: DC | PRN
  Administered 2021-06-25: 1 via OPHTHALMIC

## 2021-06-25 MED ORDER — TRAMADOL HCL 50 MG PO TABS: ORAL_TABLET | ORAL | 0 refills | Status: DC

## 2021-06-25 MED ORDER — DIPHENHYDRAMINE HCL 50 MG/ML IJ SOLN
INTRAMUSCULAR | Status: DC | PRN
  Administered 2021-06-25: 25 mg via INTRAVENOUS

## 2021-06-25 MED ORDER — OXYCODONE HCL 5 MG/5ML PO SOLN: 5.0000 mg | Freq: Once | ORAL | Status: DC | PRN

## 2021-06-25 MED ORDER — ALFENTANIL 500 MCG/ML IJ INJ
INJECTION | INTRAVENOUS | Status: DC | PRN
  Administered 2021-06-25: 400 ug via INTRAVENOUS
  Administered 2021-06-25: 600 ug via INTRAVENOUS

## 2021-06-25 SURGICAL SUPPLY — 20 items
APPLICATOR COTTON TIP WD 3 STR (MISCELLANEOUS) ×2 IMPLANT
BLADE SURG 15 STRL LF DISP TIS (BLADE) ×1 IMPLANT
BLADE SURG 15 STRL SS (BLADE) ×2
CORD BIP STRL DISP 12FT (MISCELLANEOUS) ×2 IMPLANT
GAUZE SPONGE 4X4 12PLY STRL (GAUZE/BANDAGES/DRESSINGS) ×2 IMPLANT
GLOVE SURG ENC MOIS LTX SZ7 (GLOVE) ×4 IMPLANT
GOWN STRL REUS W/ TWL LRG LVL3 (GOWN DISPOSABLE) ×1 IMPLANT
GOWN STRL REUS W/TWL LRG LVL3 (GOWN DISPOSABLE) ×2
MARKER SKIN XFINE TIP W/RULER (MISCELLANEOUS) ×2 IMPLANT
NEEDLE FILTER BLUNT 18X 1/2SAF (NEEDLE) ×1
NEEDLE FILTER BLUNT 18X1 1/2 (NEEDLE) ×1 IMPLANT
NEEDLE HYPO 30X.5 LL (NEEDLE) ×4 IMPLANT
PACK ENT CUSTOM (PACKS) ×2 IMPLANT
SOL PREP PVP 2OZ (MISCELLANEOUS) ×2
SOLUTION PREP PVP 2OZ (MISCELLANEOUS) ×1 IMPLANT
SPONGE GAUZE 2X2 8PLY STRL LF (GAUZE/BANDAGES/DRESSINGS) ×20 IMPLANT
SUT GUT PLAIN 6-0 1X18 ABS (SUTURE) ×2 IMPLANT
SYR 10ML LL (SYRINGE) ×2 IMPLANT
SYR 3ML LL SCALE MARK (SYRINGE) ×2 IMPLANT
WATER STERILE IRR 250ML POUR (IV SOLUTION) ×2 IMPLANT

## 2021-06-25 NOTE — H&P (Signed)
[place]: [facility]  Primary Care Physician:  Virginia Crews, MD Ophthalmologist: Dr. Philis Pique. Vickki Muff, M.D.  Pre-Procedure History & Physical: HPI:  Mckenzie Willis is a 70 y.o. female here for periocular surgery.   Past Medical History:  Diagnosis Date   Arthritis    Back pain, chronic    History of chicken pox    History of measles    History of mumps    Hyperlipidemia    Hypertension    Plantar fasciitis     Past Surgical History:  Procedure Laterality Date   COLONOSCOPY WITH PROPOFOL     COLONOSCOPY WITH PROPOFOL N/A 01/11/2020   Procedure: COLONOSCOPY WITH PROPOFOL;  Surgeon: Robert Bellow, MD;  Location: [facility] ENDOSCOPY;  Service: Endoscopy;  Laterality: N/A;   TOE SURGERY  2018   Dr Milinda Pointer   TUBAL LIGATION      Prior to Admission medications   Medication Sig Start Date End Date Taking? Authorizing Provider  amLODipine (NORVASC) 5 MG tablet Take 1 tablet (5 mg total) by mouth daily. 11/19/20  Yes Just, Laurita Quint, FNP  Cholecalciferol (VITAMIN D-3) 25 MCG (1000 UT) CAPS Take 1 capsule by mouth daily.   Yes [provider]  diclofenac Sodium (VOLTAREN) 1 % GEL Apply topically 4 (four) times daily.   Yes [provider]  hydrochlorothiazide (HYDRODIURIL) 25 MG tablet Take 1 tablet (25 mg total) by mouth daily. 11/19/20  Yes Just, Laurita Quint, FNP  losartan (COZAAR) 100 MG tablet Take 1 tablet (100 mg total) by mouth daily. 11/19/20  Yes Just, Laurita Quint, FNP  rosuvastatin (CRESTOR) 10 MG tablet Take 1 tablet (10 mg total) by mouth 3 (three) times a week. 05/27/21  Yes Bacigalupo, Dionne Bucy, MD  vitamin B-12 (CYANOCOBALAMIN) 1000 MCG tablet Take 1,000 mcg by mouth daily.   Yes [provider]    Allergies as of 04/16/2021   (No Known Allergies)    Family History  Problem Relation Age of Onset   Hypertension Mother    Heart disease Mother        s/p CABG in 37s   Colon polyps Mother    Hypertension Brother    Cancer  Brother        Melanoma    Hypertension Maternal Grandmother    Breast cancer Paternal Aunt 74   Emphysema Father    Diabetes Paternal Grandfather    Breast cancer Maternal Aunt        great aunt   Colon cancer Neg Hx     Social History   Socioeconomic History   Marital status: Married    Spouse name: Not on file   Number of children: 3   Years of education: Not on file   Highest education level: Not on file  Occupational History   Occupation: retired [employer]  Tobacco Use   Smoking status: Former    Packs/day: 0.75    Years: 10.00    Pack years: 7.50    Types: Cigarettes    Quit date: 1983    Years since quitting: 39.9   Smokeless tobacco: Never  Vaping Use   Vaping Use: Never used  Substance and Sexual Activity   Alcohol use: Yes    Alcohol/week: 1.0 - 4.0 standard drink    Types: 1 - 4 Glasses of wine per week   Drug use: No   Sexual activity: Not Currently  Other Topics Concern   Not on file  Social History Narrative  Lives in [place] with husband and mother. Has 2 dogs in home.      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, multimedia      Diet - regular diet, limited sugar, increased protein      Exercise - none at present   Social Determinants of Radio broadcast assistant Strain: Not on file  Food Insecurity: Not on file  Transportation Needs: Not on file  Physical Activity: Not on file  Stress: Not on file  Social Connections: Not on file  Intimate Partner Violence: Not on file    Review of Systems: See HPI, otherwise negative ROS  Physical Exam: BP (!) 160/64   Pulse 71   Temp (!) 97.3 F (36.3 C) (Temporal)   Ht 5' 4.5" (1.638 m)   Wt 73.8 kg   SpO2 98%   BMI 27.48 kg/m  General:   Alert and cooperative in NAD Head:  Normocephalic and atraumatic. Respiratory:  Normal work of breathing.  Impression/Plan: Mckenzie Willis is here for periocular surgery.  Risks, benefits, limitations, and alternatives regarding surgery have been reviewed with the  patient.  Questions have been answered.  All parties agreeable.   Karle Starch, MD  06/25/2021, 12:11 PM

## 2021-06-25 NOTE — Anesthesia Preprocedure Evaluation (Signed)
Anesthesia Evaluation  Patient identified by MRN, date of birth, ID band Patient awake    Reviewed: Allergy & Precautions, NPO status   Airway Mallampati: II  TM Distance: >3 FB     Dental   Pulmonary neg pulmonary ROS, former smoker,    Pulmonary exam normal        Cardiovascular hypertension,  Rhythm:Regular Rate:Normal  HLD   Neuro/Psych    GI/Hepatic   Endo/Other    Renal/GU      Musculoskeletal  (+) Arthritis ,   Abdominal   Peds  Hematology   Anesthesia Other Findings   Reproductive/Obstetrics                             Anesthesia Physical Anesthesia Plan  ASA: 2  Anesthesia Plan: General   Post-op Pain Management:    Induction: Intravenous  PONV Risk Score and Plan: Propofol infusion, TIVA and Treatment may vary due to age or medical condition  Airway Management Planned: Natural Airway and Nasal Cannula  Additional Equipment:   Intra-op Plan:   Post-operative Plan:   Informed Consent: I have reviewed the patients History and Physical, chart, labs and discussed the procedure including the risks, benefits and alternatives for the proposed anesthesia with the patient or authorized representative who has indicated his/her understanding and acceptance.       Plan Discussed with: CRNA  Anesthesia Plan Comments:         Anesthesia Quick Evaluation

## 2021-06-25 NOTE — Anesthesia Postprocedure Evaluation (Signed)
Anesthesia Post Note  Patient: Mckenzie Willis  Procedure(s) Performed: BLEPHAROPLASTY UPPER EYELID; W/ EXCESS SKIN BILATERAL (Bilateral: Eye)     Patient location during evaluation: PACU Anesthesia Type: General Level of consciousness: awake Pain management: pain level controlled Vital Signs Assessment: post-procedure vital signs reviewed and stable Respiratory status: respiratory function stable Cardiovascular status: stable Postop Assessment: no signs of nausea or vomiting Anesthetic complications: no   No notable events documented.  Veda Canning

## 2021-06-25 NOTE — Op Note (Signed)
Preoperative Diagnosis:  Visually significant dermatochalasis bilateral  Upper Eyelid(s)  Postoperative Diagnosis:  Same.  Procedure(s) Performed:   Upper eyelid blepharoplasty with excess skin excision  bilateral  Upper Eyelid(s)  Surgeon: Philis Pique. Vickki Muff, M.D.  Assistants: none  Anesthesia: MAC  Specimens: None.  Estimated Blood Loss: Minimal.  Complications: None.  Operative Findings: easy bleeding  Procedure:   Allergies were reviewed and the patient is allergic to Patient has no known allergies..   After the risks, benefits, complications and alternatives were discussed with the patient, appropriate informed consent was obtained and the patient was brought to the operating suite. The patient was reclined supine and a timeout was conducted.  The patient was then sedated.  Local anesthetic consisting of a 50-50 mixture of 2% lidocaine with epinephrine and 0.75% bupivacaine with added Hylenex was injected subcutaneously to bilateral  upper eyelid(s). After adequate local was instilled, the patient was prepped and draped in the usual sterile fashion for eyelid surgery.   Attention was turned to the upper eyelids. A 56m upper eyelid crease incision line was marked with calipers on both  upper eyelid(s).  A pinch test was used to estimate the amount of excess skin to remove and this was marked in standard blepharoplasty style fashion. Attention was turned to the  right  upper eyelid. A #15 blade was used to open the premarked incision line. A Skin only flap was excised and hemostasis was obtained with bipolar cautery.   Attention was then turned to the opposite eyelid where the same procedure was performed in the same manner. Hemostasis was obtained with bipolar cautery throughout. All incisions were then closed with a combination of running and interrupted 6-0 fast absorbing plain suture. The patient tolerated the procedure well.  Erythromycin ophthalmic ointment was applied to her  incision sites, followed by ice packs. She was taken to the recovery area where she recovered without difficulty.  Post-Op Plan/Instructions:  The patient was instructed to use ice packs frequently for the next 48 hours. She was instructed to use Erythromycin ophthalmic ointment on her incisions 4 times a day for the next 12 to 14 days. She was given a prescription for tramadol (or similar) for pain control should Tylenol not be effective. [facility] was [facility] [place].  Nyiesha Beever M. FVickki Muff M.D. Ophthalmology

## 2021-06-25 NOTE — Transfer of Care (Signed)
Immediate Anesthesia Transfer of Care Note  Patient: Mckenzie Willis  Procedure(s) Performed: BLEPHAROPLASTY UPPER EYELID; W/ EXCESS SKIN BILATERAL (Bilateral: Eye)  Patient Location: PACU  Anesthesia Type: General  Level of Consciousness: awake, alert  and patient cooperative  Airway and Oxygen Therapy: Patient Spontanous Breathing and Patient connected to supplemental oxygen  Post-op Assessment: Post-op Vital signs reviewed, Patient's Cardiovascular Status Stable, Respiratory Function Stable, Patent Airway and No signs of Nausea or vomiting  Post-op Vital Signs: Reviewed and stable  Complications: No notable events documented.

## 2021-06-25 NOTE — Anesthesia Procedure Notes (Signed)
Date/Time: 06/25/2021 12:30 PM Performed by: Mayme Genta, CRNA Pre-anesthesia Checklist: Patient identified, Emergency Drugs available, Suction available, Timeout performed and Patient being monitored Patient Re-evaluated:Patient Re-evaluated prior to induction Oxygen Delivery Method: Nasal cannula Placement Confirmation: positive ETCO2

## 2021-06-25 NOTE — Interval H&P Note (Signed)
History and Physical Interval Note:  06/25/2021 12:11 PM  Mckenzie Willis  has presented today for surgery, with the diagnosis of H02.831 Dermatochalasis of Right Upper Eyelid H02.834 Dermatochalasis of Left Upper Eyelid.  The various methods of treatment have been discussed with the patient and family. After consideration of risks, benefits and other options for treatment, the patient has consented to  Procedure(s): BLEPHAROPLASTY UPPER EYELID; W/ EXCESS SKIN BILATERAL (Bilateral) as a surgical intervention.  The patient's history has been reviewed, patient examined, no change in status, stable for surgery.  I have reviewed the patient's chart and labs.  Questions were answered to the patient's satisfaction.     Vickki Muff, Samanyu Tinnell M

## 2021-06-26 ENCOUNTER — Encounter: Payer: Self-pay | Admitting: Ophthalmology

## 2021-07-03 ENCOUNTER — Encounter: Payer: Self-pay | Admitting: Ophthalmology

## 2021-08-13 ENCOUNTER — Other Ambulatory Visit: Payer: Self-pay | Admitting: Family Medicine

## 2021-08-13 DIAGNOSIS — I10 Essential (primary) hypertension: Secondary | ICD-10-CM

## 2021-08-13 MED ORDER — LOSARTAN POTASSIUM 100 MG PO TABS: 100.0000 mg | ORAL_TABLET | Freq: Every day | ORAL | 1 refills | Status: DC

## 2021-08-13 MED ORDER — HYDROCHLOROTHIAZIDE 25 MG PO TABS: 25.0000 mg | ORAL_TABLET | Freq: Every day | ORAL | 1 refills | Status: DC

## 2021-08-13 NOTE — Telephone Encounter (Signed)
Medication Refill - Medication:   losartan (COZAAR) 100 MG tablet   hydrochlorothiazide (HYDRODIURIL) 25 MG tablet   Has the patient contacted their pharmacy? Yes.   Waiting for response from Drs office.    Preferred Pharmacy (with phone number or street name):   [id number] - [place], [place]  [address] [place] [address]  Phone: [phone] Fax: [phone]   Has the patient been seen for an appointment in the last year OR does the patient have an upcoming appointment? Yes.    Agent: Please be advised that RX refills may take up to 3 business days. We ask that you follow-up with your pharmacy.

## 2021-08-13 NOTE — Telephone Encounter (Signed)
Requested Prescriptions  Pending Prescriptions Disp Refills   hydrochlorothiazide (HYDRODIURIL) 25 MG tablet 90 tablet 1    Sig: Take 1 tablet (25 mg total) by mouth daily.     Cardiovascular: Diuretics - Thiazide Failed - 08/13/2021 12:36 PM      Failed - Last BP in normal range    BP Readings from Last 1 Encounters:  06/25/21 (!) 147/63         Passed - Ca in normal range and within 360 days    Calcium  Date Value Ref Range Status  05/26/2021 9.8 8.7 - 10.3 mg/dL Final         Passed - Cr in normal range and within 360 days    Creatinine, Ser  Date Value Ref Range Status  05/26/2021 0.76 0.57 - 1.00 mg/dL Final         Passed - K in normal range and within 360 days    Potassium  Date Value Ref Range Status  05/26/2021 4.2 3.5 - 5.2 mmol/L Final         Passed - Na in normal range and within 360 days    Sodium  Date Value Ref Range Status  05/26/2021 140 134 - 144 mmol/L Final         Passed - Valid encounter within last 6 months    Recent Outpatient Visits          2 months ago Benign essential HTN   [place], Dionne Bucy, MD   8 months ago Benign essential HTN   [place] Just, Laurita Quint, FNP   11 months ago Benign essential HTN   [place], Dionne Bucy, MD   1 year ago Benign essential HTN   [facility] [place], Dionne Bucy, MD   1 year ago Welcome to Medicare preventive visit   [facility] [place], Dionne Bucy, MD              losartan (COZAAR) 100 MG tablet 90 tablet 1    Sig: Take 1 tablet (100 mg total) by mouth daily.     Cardiovascular:  Angiotensin Receptor Blockers Failed - 08/13/2021 12:36 PM      Failed - Last BP in normal range    BP Readings from Last 1 Encounters:  06/25/21 (!) 147/63         Passed - Cr in normal range and within 180 days    Creatinine, Ser  Date Value Ref Range Status  05/26/2021 0.76 0.57 - 1.00 mg/dL Final          Passed - K in normal range and within 180 days    Potassium  Date Value Ref Range Status  05/26/2021 4.2 3.5 - 5.2 mmol/L Final         Passed - Patient is not pregnant      Passed - Valid encounter within last 6 months    Recent Outpatient Visits          2 months ago Benign essential HTN   [place], Dionne Bucy, MD   8 months ago Benign essential HTN   [place] Just, Laurita Quint, FNP   11 months ago Benign essential HTN   [place], Dionne Bucy, MD   1 year ago Benign essential HTN   [facility] [place], Dionne Bucy, MD   1 year ago [facility] to [facility] preventive visit   [facility],  Dionne Bucy, MD

## 2021-11-11 ENCOUNTER — Other Ambulatory Visit: Payer: Self-pay | Admitting: Family Medicine

## 2021-11-11 DIAGNOSIS — I10 Essential (primary) hypertension: Secondary | ICD-10-CM

## 2021-11-11 MED ORDER — AMLODIPINE BESYLATE 5 MG PO TABS: 5.0000 mg | ORAL_TABLET | Freq: Every day | ORAL | 3 refills | Status: DC

## 2021-11-11 NOTE — Telephone Encounter (Signed)
Requested medication (s) are due for refill today:   Yes ? ?Requested medication (s) are on the active medication list:   Yes ? ?Future visit scheduled:   No ? ? ?Last ordered: 11/19/2020 #90, 3 refills ? ?Returned because pharmacy needs a new rx  ? ?Requested Prescriptions  ?Pending Prescriptions Disp Refills  ? amLODipine (NORVASC) 5 MG tablet 90 tablet 3  ?  Sig: Take 1 tablet (5 mg total) by mouth daily.  ?  ? Cardiovascular: Calcium Channel Blockers 2 Failed - 11/11/2021 12:18 PM  ?  ?  Failed - Last BP in normal range  ?  BP Readings from Last 1 Encounters:  ?06/25/21 (!) 147/63  ?  ?  ?  ?  Passed - Last Heart Rate in normal range  ?  Pulse Readings from Last 1 Encounters:  ?06/25/21 72  ?  ?  ?  ?  Passed - Valid encounter within last 6 months  ?  Recent Outpatient Visits   ? ?      ? 5 months ago Benign essential HTN  ? [facility] [place], Dionne Bucy, MD  ? 11 months ago Benign essential HTN  ? [employer] Just, Laurita Quint, FNP  ? 1 year ago Benign essential HTN  ? [facility] [place], Dionne Bucy, MD  ? 1 year ago Benign essential HTN  ? [facility] [place], Dionne Bucy, MD  ? 2 years ago Welcome to Medicare preventive visit  ? [facility] Bacigalupo, Dionne Bucy, MD  ? ?  ?  ? ? ?  ?  ?  ? ?

## 2021-11-11 NOTE — Telephone Encounter (Signed)
Medication Refill - Medication: amLODipine (NORVASC) 5 MG tablet  ? ?Has the patient contacted their pharmacy? Yes.   ? ?(Agent: If yes, when and what did the pharmacy advise?) Need Rx from Dr. Jacinto Reap  ? ?Preferred Pharmacy (with phone number or street name):  ?[id number] [place] [facility] [address] Phone:  [phone] ?Fax:  [phone] ?  ? ?Has the patient been seen for an appointment in the last year OR does the patient have an upcoming appointment? Yes.   ? ?Agent: Please be advised that RX refills may take up to 3 business days. We ask that you follow-up with your pharmacy. ? ?

## 2021-11-23 ENCOUNTER — Other Ambulatory Visit: Payer: Self-pay | Admitting: Family Medicine

## 2021-11-23 DIAGNOSIS — Z1231 Encounter for screening mammogram for malignant neoplasm of breast: Secondary | ICD-10-CM

## 2021-11-24 ENCOUNTER — Ambulatory Visit (INDEPENDENT_AMBULATORY_CARE_PROVIDER_SITE_OTHER): Payer: PPO | Admitting: Family Medicine

## 2021-11-24 ENCOUNTER — Encounter: Payer: Self-pay | Admitting: Family Medicine

## 2021-11-24 VITALS — BP 132/60 | HR 65 | Temp 98.1°F | Resp 16 | Ht 63.0 in | Wt 154.0 lb

## 2021-11-24 DIAGNOSIS — E041 Nontoxic single thyroid nodule: Secondary | ICD-10-CM

## 2021-11-24 DIAGNOSIS — Z780 Asymptomatic menopausal state: Secondary | ICD-10-CM | POA: Diagnosis not present

## 2021-11-24 DIAGNOSIS — E663 Overweight: Secondary | ICD-10-CM | POA: Diagnosis not present

## 2021-11-24 DIAGNOSIS — M8589 Other specified disorders of bone density and structure, multiple sites: Secondary | ICD-10-CM

## 2021-11-24 DIAGNOSIS — R739 Hyperglycemia, unspecified: Secondary | ICD-10-CM | POA: Diagnosis not present

## 2021-11-24 DIAGNOSIS — I10 Essential (primary) hypertension: Secondary | ICD-10-CM | POA: Diagnosis not present

## 2021-11-24 DIAGNOSIS — E538 Deficiency of other specified B group vitamins: Secondary | ICD-10-CM

## 2021-11-24 DIAGNOSIS — Z0000 Encounter for general adult medical examination without abnormal findings: Secondary | ICD-10-CM | POA: Diagnosis not present

## 2021-11-24 DIAGNOSIS — E782 Mixed hyperlipidemia: Secondary | ICD-10-CM

## 2021-11-24 DIAGNOSIS — E559 Vitamin D deficiency, unspecified: Secondary | ICD-10-CM

## 2021-11-24 NOTE — Assessment & Plan Note (Signed)
Discussed importance of healthy weight management Discussed diet and exercise  

## 2021-11-24 NOTE — Assessment & Plan Note (Signed)
Previously well controlled Continue statin Repeat FLP and CMP  

## 2021-11-24 NOTE — Patient Instructions (Addendum)
The CDC recommends two doses of Shingrix (the shingles vaccine) separated by 2 to 6 months for adults age 69 years and older. I recommend checking with your insurance plan regarding coverage for this vaccine.  ? ? ?Consider asking at the pharmacy about the tetanus shot (TDAP) and the shingles shot (Shingrix) and prevnar (pneumonia shot) ? ?

## 2021-11-24 NOTE — Assessment & Plan Note (Signed)
Continue Vit D supplement and Ca in diet ?Repeat DEXA ?

## 2021-11-24 NOTE — Assessment & Plan Note (Signed)
Well controlled Continue current medications Recheck metabolic panel F/u in 6 months  

## 2021-11-24 NOTE — Assessment & Plan Note (Signed)
Thyroid nodules b/l stable since 2013  ?Previous radioactive iodine uptake showed normal function ?Continue to monitor TSH ?

## 2021-11-24 NOTE — Progress Notes (Signed)
? ? ?I,Sulibeya S Dimas,acting as a scribe for Lavon Paganini, MD.,have documented all relevant documentation on the behalf of Lavon Paganini, MD,as directed by  Lavon Paganini, MD while in the presence of Lavon Paganini, MD. ? ? ?Annual Wellness Visit ? ?  ? ?Patient: Mckenzie Willis, Female    DOB: Jun 24, 1951, 71 y.o.   MRN: [id number] ?Visit Date: 11/24/2021 ? ?Today's Provider: Lavon Paganini, MD  ? ?Chief Complaint  ?Patient presents with  ? Medicare Wellness  ? ?Subjective  ?  ?Mckenzie Willis is a 71 y.o. female who presents today for her Annual Wellness Visit. ?She reports consuming a  low sugar, low carb  diet. The patient does not participate in regular exercise at present. Reports working in the yard.  She generally feels well. She reports sleeping well. She does not have additional problems to discuss today.  ? ?HPI ? ? ? ?Medications: ?Outpatient Medications Prior to Visit  ?Medication Sig  ? amLODipine (NORVASC) 5 MG tablet Take 1 tablet (5 mg total) by mouth daily.  ? Cholecalciferol (VITAMIN D-3) 25 MCG (1000 UT) CAPS Take 1 capsule by mouth daily.  ? diclofenac Sodium (VOLTAREN) 1 % GEL Apply topically 4 (four) times daily.  ? erythromycin ophthalmic ointment Apply to sutures 4 times a day for 10-12 days.  Discontinue if allergy develops and call our office  ? hydrochlorothiazide (HYDRODIURIL) 25 MG tablet Take 1 tablet (25 mg total) by mouth daily.  ? losartan (COZAAR) 100 MG tablet Take 1 tablet (100 mg total) by mouth daily.  ? rosuvastatin (CRESTOR) 10 MG tablet Take 1 tablet (10 mg total) by mouth 3 (three) times a week.  ? traMADol (ULTRAM) 50 MG tablet Take 1 every 4-6 hours as needed for pain not controlled by Tylenol  ? vitamin B-12 (CYANOCOBALAMIN) 1000 MCG tablet Take 1,000 mcg by mouth daily.  ? ?No facility-administered medications prior to visit.  ?  ?No Known Allergies ? ?Patient Care Team: ?Virginia Crews, MD as PCP - General (Family Medicine) ? ?Review  of Systems  ?Musculoskeletal:  Positive for arthralgias, back pain and myalgias.  ?All other systems reviewed and are negative. ? ?Last CBC ?Lab Results  ?Component Value Date  ? WBC 5.1 11/27/2020  ? HGB 13.0 11/27/2020  ? HCT 38.4 11/27/2020  ? MCV 88 11/27/2020  ? MCH 29.9 11/27/2020  ? RDW 13.0 11/27/2020  ? PLT 241 11/27/2020  ? ?Last metabolic panel ?Lab Results  ?Component Value Date  ? GLUCOSE 93 05/26/2021  ? NA 140 05/26/2021  ? K 4.2 05/26/2021  ? CL 101 05/26/2021  ? CO2 26 05/26/2021  ? BUN 14 05/26/2021  ? CREATININE 0.76 05/26/2021  ? EGFR 85 05/26/2021  ? CALCIUM 9.8 05/26/2021  ? PROT 7.5 05/26/2021  ? ALBUMIN 4.8 05/26/2021  ? LABGLOB 2.7 05/26/2021  ? AGRATIO 1.8 05/26/2021  ? BILITOT 0.7 05/26/2021  ? ALKPHOS 80 05/26/2021  ? AST 26 05/26/2021  ? ALT 19 05/26/2021  ? ?Last lipids ?Lab Results  ?Component Value Date  ? CHOL 208 (H) 05/26/2021  ? HDL 70 05/26/2021  ? LDLCALC 118 (H) 05/26/2021  ? TRIG 114 05/26/2021  ? CHOLHDL 3.0 05/26/2021  ? ?Last hemoglobin A1c ?Lab Results  ?Component Value Date  ? HGBA1C 5.6 05/26/2021  ? ?Last thyroid functions ?Lab Results  ?Component Value Date  ? TSH 2.410 05/26/2021  ? ?Last vitamin D ?Lab Results  ?Component Value Date  ? VD25OH 46.9 11/27/2020  ? ?Last vitamin  B12 and Folate ?Lab Results  ?Component Value Date  ? [id number] 811 11/27/2020  ? ?  ?  ? Objective  ?  ?Vitals: BP 132/60 (BP Location: Left Arm, Patient Position: Sitting, Cuff Size: Large)   Pulse 65   Temp 98.1 ?F (36.7 ?C) (Oral)   Resp 16   Ht _0  (1.6 m)   Wt 154 lb (69.9 kg)   SpO2 98%   BMI 27.28 kg/m?  ?BP Readings from Last 3 Encounters:  ?11/24/21 132/60  ?06/25/21 (!) 147/63  ?05/26/21 130/60  ? ?Wt Readings from Last 3 Encounters:  ?11/24/21 154 lb (69.9 kg)  ?06/25/21 162 lb 9.6 oz (73.8 kg)  ?05/26/21 161 lb 11.2 oz (73.3 kg)  ? ?  ?Physical Exam ?Vitals reviewed.  ?Constitutional:   ?   General: She is not in acute distress. ?   Appearance: Normal appearance. She is  well-developed. She is not diaphoretic.  ?HENT:  ?   Head: Normocephalic and atraumatic.  ?   Right Ear: Tympanic membrane, ear canal and external ear normal.  ?   Left Ear: Tympanic membrane, ear canal and external ear normal.  ?   Nose: Nose normal.  ?   Mouth/Throat:  ?   Mouth: Mucous membranes are moist.  ?   Pharynx: Oropharynx is clear. No oropharyngeal exudate.  ?Eyes:  ?   General: No scleral icterus. ?   Conjunctiva/sclera: Conjunctivae normal.  ?   Pupils: Pupils are equal, round, and reactive to light.  ?Neck:  ?   Thyroid: No thyromegaly.  ?Cardiovascular:  ?   Rate and Rhythm: Normal rate and regular rhythm.  ?   Pulses: Normal pulses.  ?   Heart sounds: Normal heart sounds. No murmur heard. ?Pulmonary:  ?   Effort: Pulmonary effort is normal. No respiratory distress.  ?   Breath sounds: Normal breath sounds. No wheezing or rales.  ?Abdominal:  ?   General: There is no distension.  ?   Palpations: Abdomen is soft.  ?   Tenderness: There is no abdominal tenderness.  ?Musculoskeletal:     ?   General: No deformity.  ?   Cervical back: Neck supple.  ?   Right lower leg: No edema.  ?   Left lower leg: No edema.  ?Lymphadenopathy:  ?   Cervical: No cervical adenopathy.  ?Skin: ?   General: Skin is warm and dry.  ?   Findings: No rash.  ?Neurological:  ?   Mental Status: She is alert and oriented to person, place, and time. Mental status is at baseline.  ?   Gait: Gait normal.  ?Psychiatric:     ?   Mood and Affect: Mood normal.     ?   Behavior: Behavior normal.     ?   Thought Content: Thought content normal.  ? ? ? ?Most recent functional status assessment: ? ?  11/24/2021  ? 10:27 AM  ?In your present state of health, do you have any difficulty performing the following activities:  ?Hearing? 0  ?Vision? 0  ?Difficulty concentrating or making decisions? 0  ?Walking or climbing stairs? 0  ?Dressing or bathing? 0  ?Doing errands, shopping? 0  ? ?Most recent fall risk assessment: ? ?  11/24/2021  ? 10:26 AM   ?Fall Risk   ?Falls in the past year? 0  ?Number falls in past yr: 0  ?Injury with Fall? 0  ?Risk for fall due to : No Fall Risks  ?  Follow up Falls evaluation completed  ? ? Most recent depression screenings: ? ?  11/24/2021  ? 10:27 AM 05/26/2021  ? 10:34 AM  ?PHQ 2/9 Scores  ?PHQ - 2 Score 0 0  ?PHQ- 9 Score 0 0  ? ?Most recent cognitive screening: ? ?  11/24/2021  ? 10:27 AM  ?6CIT Screen  ?What Year? 0 points  ?What month? 0 points  ?What time? 0 points  ?Count back from 20 0 points  ?Months in reverse 0 points  ?Repeat phrase 0 points  ?Total Score 0 points  ? ?Most recent Audit-C alcohol use screening ? ?  11/24/2021  ? 10:27 AM  ?Alcohol Use Disorder Test (AUDIT)  ?1. How often do you have a drink containing alcohol? 1  ?2. How many drinks containing alcohol do you have on a typical day when you are drinking? 0  ?3. How often do you have six or more drinks on one occasion? 0  ?AUDIT-C Score 1  ? ?A score of 3 or more in women, and 4 or more in men indicates increased risk for alcohol abuse, EXCEPT if all of the points are from question 1  ? ?No results found for any visits on 11/24/21. ? Assessment & Plan  ?  ? ?Annual wellness visit done today including the all of the following: ?Reviewed patient's Family Medical History ?Reviewed and updated list of patient's medical providers ?Assessment of cognitive impairment was done ?Assessed patient's functional ability ?Established a written schedule for health screening services ?Health Risk Assessent Completed and Reviewed ? ?Exercise Activities and Dietary recommendations ? Goals   ?None ?  ? ? ?Immunization History  ?Administered Date(s) Administered  ? Influenza-Unspecified 06/26/2015  ? ? ?Health Maintenance  ?Topic Date Due  ? TETANUS/TDAP  Never done  ? Zoster Vaccines- Shingrix (1 of 2) Never done  ? COVID-19 Vaccine (1) 12/10/2021 (Originally 02/27/1951)  ? Pneumonia Vaccine 76+ Years old (1 - PCV) 05/26/2022 (Originally 08/30/2017)  ? INFLUENZA VACCINE  02/23/2022   ? MAMMOGRAM  01/02/2023  ? COLONOSCOPY (Pts 45-44yr Insurance coverage will need to be confirmed)  01/10/2025  ? DEXA SCAN  Completed  ? Hepatitis C Screening  Completed  ? HPV VACCINES  Aged Out  ? ? ? ?Discussed

## 2021-11-25 LAB — COMPREHENSIVE METABOLIC PANEL
ALT: 9 IU/L (ref 0–32)
AST: 15 IU/L (ref 0–40)
Albumin/Globulin Ratio: 1.6 (ref 1.2–2.2)
Albumin: 4.5 g/dL (ref 3.8–4.8)
Alkaline Phosphatase: 77 IU/L (ref 44–121)
BUN/Creatinine Ratio: 16 (ref 12–28)
BUN: 12 mg/dL (ref 8–27)
Bilirubin Total: 0.5 mg/dL (ref 0.0–1.2)
CO2: 26 mmol/L (ref 20–29)
Calcium: 9.8 mg/dL (ref 8.7–10.3)
Chloride: 103 mmol/L (ref 96–106)
Creatinine, Ser: 0.76 mg/dL (ref 0.57–1.00)
Globulin, Total: 2.8 g/dL (ref 1.5–4.5)
Glucose: 94 mg/dL (ref 70–99)
Potassium: 4.1 mmol/L (ref 3.5–5.2)
Sodium: 143 mmol/L (ref 134–144)
Total Protein: 7.3 g/dL (ref 6.0–8.5)
eGFR: 85 mL/min/{1.73_m2} (ref >59)

## 2021-11-25 LAB — TSH: TSH: 2.59 u[IU]/mL (ref 0.450–4.500)

## 2021-11-25 LAB — LIPID PANEL WITH LDL/HDL RATIO
Cholesterol, Total: 178 mg/dL (ref 100–199)
HDL: 65 mg/dL (ref >39)
LDL Chol Calc (NIH): 95 mg/dL (ref 0–99)
LDL/HDL Ratio: 1.5 ratio (ref 0.0–3.2)
Triglycerides: 102 mg/dL (ref 0–149)
VLDL Cholesterol Cal: 18 mg/dL (ref 5–40)

## 2021-11-25 LAB — HEMOGLOBIN A1C
Est. average glucose Bld gHb Est-mCnc: 120 mg/dL
Hgb A1c MFr Bld: 5.8 % — ABNORMAL HIGH (ref 4.8–5.6)

## 2021-11-25 LAB — VITAMIN D 25 HYDROXY (VIT D DEFICIENCY, FRACTURES): Vit D, 25-Hydroxy: 47.9 ng/mL (ref 30.0–100.0)

## 2021-11-25 LAB — VITAMIN B12: Vitamin B-12: 885 pg/mL (ref 232–1245)

## 2021-11-26 ENCOUNTER — Other Ambulatory Visit: Payer: Self-pay | Admitting: Family Medicine

## 2021-11-26 DIAGNOSIS — L570 Actinic keratosis: Secondary | ICD-10-CM | POA: Diagnosis not present

## 2021-11-26 DIAGNOSIS — E782 Mixed hyperlipidemia: Secondary | ICD-10-CM

## 2021-11-26 DIAGNOSIS — L508 Other urticaria: Secondary | ICD-10-CM | POA: Diagnosis not present

## 2021-11-26 DIAGNOSIS — C44319 Basal cell carcinoma of skin of other parts of face: Secondary | ICD-10-CM | POA: Diagnosis not present

## 2021-11-26 DIAGNOSIS — D485 Neoplasm of uncertain behavior of skin: Secondary | ICD-10-CM | POA: Diagnosis not present

## 2022-01-04 DIAGNOSIS — C44319 Basal cell carcinoma of skin of other parts of face: Secondary | ICD-10-CM | POA: Diagnosis not present

## 2022-01-19 NOTE — Progress Notes (Signed)
Established patient visit   Patient: Mckenzie Willis   DOB: 1950/12/23   71 y.o. Female  MRN: [id number] Visit Date: 01/22/2022  Today's healthcare provider: Gwyneth Sprout, FNP  Introduced to nurse practitioner role and practice setting.  All questions answered.  Discussed provider/patient relationship and expectations.   I,Tiffany J Bragg,acting as a scribe for Gwyneth Sprout, FNP.,have documented all relevant documentation on the behalf of Gwyneth Sprout, FNP,as directed by  Gwyneth Sprout, FNP while in the presence of Gwyneth Sprout, FNP.   Chief Complaint  Patient presents with   Hypertension   Subjective    HPI  Hypertension, follow-up  BP Readings from Last 3 Encounters:  01/22/22 133/73  11/24/21 132/60  06/25/21 (!) 147/63   Wt Readings from Last 3 Encounters:  01/22/22 153 lb 3.2 oz (69.5 kg)  11/24/21 154 lb (69.9 kg)  06/25/21 162 lb 9.6 oz (73.8 kg)     She was last seen for hypertension 2 months ago.  BP at that visit was 132/60. Management since that visit includes continue medications.  She reports poor compliance with treatment. States she weaned herself off amlodipine due to possible side effects. She is having side effects. Hypotension, fatigue, and disorientation She is following a  low carb, balanced  diet. She is exercising. She does not smoke.  Use of agents associated with hypertension: none.   Outside blood pressures are around 85/55- 160/95 Symptoms: No chest pain No chest pressure  No palpitations No syncope  No dyspnea No orthopnea  No paroxysmal nocturnal dyspnea No lower extremity edema   Pertinent labs Lab Results  Component Value Date   CHOL 178 11/24/2021   HDL 65 11/24/2021   LDLCALC 95 11/24/2021   TRIG 102 11/24/2021   CHOLHDL 3.0 05/26/2021   Lab Results  Component Value Date   NA 143 11/24/2021   K 4.1 11/24/2021   CREATININE 0.76 11/24/2021   EGFR 85 11/24/2021   GLUCOSE 94 11/24/2021   TSH 2.590 11/24/2021      The 10-year ASCVD risk score (Arnett DK, et al., 2019) is: 11.3%  ---------------------------------------------------------------------------------------------------   Medications: Outpatient Medications Prior to Visit  Medication Sig   Cholecalciferol (VITAMIN D-3) 25 MCG (1000 UT) CAPS Take 1 capsule by mouth daily.   diclofenac Sodium (VOLTAREN) 1 % GEL Apply topically 4 (four) times daily.   erythromycin ophthalmic ointment Apply to sutures 4 times a day for 10-12 days.  Discontinue if allergy develops and call our office   rosuvastatin (CRESTOR) 10 MG tablet TAKE 1 TABLET BY MOUTH 3 TIMES A WEEK.   traMADol (ULTRAM) 50 MG tablet Take 1 every 4-6 hours as needed for pain not controlled by Tylenol   vitamin B-12 (CYANOCOBALAMIN) 1000 MCG tablet Take 1,000 mcg by mouth daily.   [DISCONTINUED] amLODipine (NORVASC) 5 MG tablet Take 1 tablet (5 mg total) by mouth daily.   [DISCONTINUED] hydrochlorothiazide (HYDRODIURIL) 25 MG tablet Take 1 tablet (25 mg total) by mouth daily.   [DISCONTINUED] losartan (COZAAR) 100 MG tablet Take 1 tablet (100 mg total) by mouth daily.   No facility-administered medications prior to visit.    Review of Systems    Objective    BP 133/73   Pulse 67   Temp 98.4 F (36.9 C) (Oral)   Resp 16   Ht $R'5\' 4"'dS$  (1.626 m)   Wt 153 lb 3.2 oz (69.5 kg)   SpO2 98%   BMI 26.30 kg/m  Physical Exam Vitals and nursing note reviewed.  Constitutional:      General: She is not in acute distress.    Appearance: Normal appearance. She is overweight. She is not ill-appearing, toxic-appearing or diaphoretic.  HENT:     Head: Normocephalic and atraumatic.  Cardiovascular:     Rate and Rhythm: Normal rate and regular rhythm.     Pulses: Normal pulses.     Heart sounds: Normal heart sounds. No murmur heard.    No friction rub. No gallop.  Pulmonary:     Effort: Pulmonary effort is normal. No respiratory distress.     Breath sounds: Normal breath sounds. No  stridor. No wheezing, rhonchi or rales.  Chest:     Chest wall: No tenderness.  Abdominal:     General: Bowel sounds are normal.     Palpations: Abdomen is soft.  Musculoskeletal:        General: No swelling, tenderness, deformity or signs of injury. Normal range of motion.     Right lower leg: No edema.     Left lower leg: No edema.  Skin:    General: Skin is warm and dry.     Capillary Refill: Capillary refill takes less than 2 seconds.     Coloration: Skin is not jaundiced or pale.     Findings: No bruising, erythema, lesion or rash.  Neurological:     General: No focal deficit present.     Mental Status: She is alert and oriented to person, place, and time. Mental status is at baseline.     Cranial Nerves: No cranial nerve deficit.     Sensory: No sensory deficit.     Motor: No weakness.     Coordination: Coordination normal.  Psychiatric:        Mood and Affect: Mood normal.        Behavior: Behavior normal.        Thought Content: Thought content normal.        Judgment: Judgment normal.        No results found for any visits on 01/22/22.  Assessment & Plan     Problem List Items Addressed This Visit       Cardiovascular and Mediastinum   Benign essential HTN    Chronic, variable in last 3 weeks Denies external stressors Bps have ranged from 80s/50s-160s/90s Recommend 1/2 of HCTZ to 12.5, adding back 2.5 mg of Norvasc, pt previously stopped, prev dose of $Remov'5mg'pxrLlw$ , when BP was in 80s/50s without consult, and continue losartan at 100 mg RTC in 1 month Continue to emphasize 64 oz/water/day      Relevant Medications   amLODipine (NORVASC) 5 MG tablet   losartan-hydrochlorothiazide (HYZAAR) 100-12.5 MG tablet   hydrochlorothiazide (HYDRODIURIL) 25 MG tablet    Return in about 4 weeks (around 02/19/2022).      Vonna Kotyk, FNP, have reviewed all documentation for this visit. The documentation on 01/22/22 for the exam, diagnosis, procedures, and orders are all  accurate and complete.    Gwyneth Sprout, [place] [phone] (phone) [phone] (fax)  [place]

## 2022-01-20 ENCOUNTER — Ambulatory Visit
Admission: RE | Admit: 2022-01-20 | Discharge: 2022-01-20 | Disposition: A | Discharge status: Home / Self Care | Payer: PPO | Source: Ambulatory Visit | Attending: Family Medicine | Admitting: Family Medicine

## 2022-01-20 DIAGNOSIS — Z780 Asymptomatic menopausal state: Secondary | ICD-10-CM | POA: Diagnosis not present

## 2022-01-20 DIAGNOSIS — Z0000 Encounter for general adult medical examination without abnormal findings: Secondary | ICD-10-CM | POA: Insufficient documentation

## 2022-01-20 DIAGNOSIS — Z1231 Encounter for screening mammogram for malignant neoplasm of breast: Secondary | ICD-10-CM

## 2022-01-20 DIAGNOSIS — M8589 Other specified disorders of bone density and structure, multiple sites: Secondary | ICD-10-CM | POA: Diagnosis not present

## 2022-01-22 ENCOUNTER — Ambulatory Visit (INDEPENDENT_AMBULATORY_CARE_PROVIDER_SITE_OTHER): Payer: PPO | Admitting: Family Medicine

## 2022-01-22 ENCOUNTER — Encounter: Payer: Self-pay | Admitting: Family Medicine

## 2022-01-22 DIAGNOSIS — I10 Essential (primary) hypertension: Secondary | ICD-10-CM

## 2022-01-22 MED ORDER — AMLODIPINE BESYLATE 5 MG PO TABS: 2.5000 mg | ORAL_TABLET | Freq: Every day | ORAL | 3 refills | Status: DC

## 2022-01-22 MED ORDER — HYDROCHLOROTHIAZIDE 25 MG PO TABS: 12.5000 mg | ORAL_TABLET | Freq: Every day | ORAL | 1 refills | Status: DC

## 2022-01-22 MED ORDER — LOSARTAN POTASSIUM-HCTZ 100-12.5 MG PO TABS: 1.0000 | ORAL_TABLET | Freq: Every day | ORAL | 1 refills | Status: DC

## 2022-01-22 NOTE — Assessment & Plan Note (Signed)
Chronic, variable in last 3 weeks Denies external stressors Bps have ranged from 80s/50s-160s/90s Recommend 1/2 of HCTZ to 12.5, adding back 2.5 mg of Norvasc, pt previously stopped, prev dose of '5mg'$ , when BP was in 80s/50s without consult, and continue losartan at 100 mg RTC in 1 month Continue to emphasize 64 oz/water/day

## 2022-01-22 NOTE — Patient Instructions (Signed)
The 10-year ASCVD risk score (Arnett DK, et al., 2019) is: 14.5%   Values used to calculate the score:     Age: 71 years     Sex: Female     Is Non-Hispanic African American: No     Diabetic: No     Tobacco smoker: No     Systolic Blood Pressure: 650 mmHg     Is BP treated: Yes     HDL Cholesterol: 65 mg/dL     Total Cholesterol: 178 mg/dL

## 2022-02-06 ENCOUNTER — Other Ambulatory Visit: Payer: Self-pay | Admitting: Family Medicine

## 2022-02-06 DIAGNOSIS — I10 Essential (primary) hypertension: Secondary | ICD-10-CM

## 2022-02-19 ENCOUNTER — Encounter: Payer: Self-pay | Admitting: Family Medicine

## 2022-02-19 ENCOUNTER — Ambulatory Visit (INDEPENDENT_AMBULATORY_CARE_PROVIDER_SITE_OTHER): Payer: PPO | Admitting: Family Medicine

## 2022-02-19 VITALS — BP 124/66 | HR 67 | Temp 98.8°F | Resp 14 | Wt 151.0 lb

## 2022-02-19 DIAGNOSIS — E782 Mixed hyperlipidemia: Secondary | ICD-10-CM

## 2022-02-19 DIAGNOSIS — I10 Essential (primary) hypertension: Secondary | ICD-10-CM

## 2022-02-19 MED ORDER — AMLODIPINE BESYLATE 2.5 MG PO TABS: 2.5000 mg | ORAL_TABLET | Freq: Every day | ORAL | 3 refills | Status: DC

## 2022-02-19 MED ORDER — ROSUVASTATIN CALCIUM 10 MG PO TABS: ORAL_TABLET | ORAL | 3 refills | Status: DC

## 2022-02-19 NOTE — Progress Notes (Signed)
I,Roshena L Chambers,acting as a scribe for Gwyneth Sprout, FNP.,have documented all relevant documentation on the behalf of Gwyneth Sprout, FNP,as directed by  Gwyneth Sprout, FNP while in the presence of Gwyneth Sprout, FNP.   Established patient visit   Patient: Mckenzie Willis   DOB: [birth year]   71 y.o. Female  MRN: [id number] Visit Date: 02/19/2022  Today's healthcare provider: Gwyneth Sprout, FNP  Re Introduced to nurse practitioner role and practice setting.  All questions answered.  Discussed provider/patient relationship and expectations.   Chief Complaint  Patient presents with   Hypertension   Subjective    HPI  Hypertension, follow-up  BP Readings from Last 3 Encounters:  02/19/22 124/66  01/22/22 133/73  11/24/21 132/60   Wt Readings from Last 3 Encounters:  02/19/22 151 lb (68.5 kg)  01/22/22 153 lb 3.2 oz (69.5 kg)  11/24/21 154 lb (69.9 kg)     She was last seen for hypertension 4 months ago.  BP at that visit was 133/73. Management since that visit includes recommend 1/2 of HCTZ to 12.5, adding back 2.5 mg of Norvasc, pt previously stopped, prev dose of $Remov'5mg'RikKKf$ , when BP was in 80s/50s without consult, and continue losartan at 100 mg. RTC in 1 month. Continue to emphasize 60 oz/water/day.  She reports good compliance with treatment. She is not having side effects.  She is following a Regular diet. She is not exercising. She does not smoke.  Use of agents associated with hypertension: none.   Outside blood pressures are 110-140/ 60-70's. One reading of 155 SBP on 02/01/22 noted without further complaint.  Symptoms: No chest pain No chest pressure  No palpitations No syncope  No dyspnea No orthopnea  No paroxysmal nocturnal dyspnea No lower extremity edema   Pertinent labs Lab Results  Component Value Date   CHOL 178 11/24/2021   HDL 65 11/24/2021   LDLCALC 95 11/24/2021   TRIG 102 11/24/2021   CHOLHDL 3.0 05/26/2021   Lab Results  Component  Value Date   NA 143 11/24/2021   K 4.1 11/24/2021   CREATININE 0.76 11/24/2021   EGFR 85 11/24/2021   GLUCOSE 94 11/24/2021   TSH 2.590 11/24/2021     The 10-year ASCVD risk score (Arnett DK, et al., 2019) is: 9.9%  ---------------------------------------------------------------------------------------------------   Medications: Outpatient Medications Prior to Visit  Medication Sig   Cholecalciferol (VITAMIN D-3) 25 MCG (1000 UT) CAPS Take 1 capsule by mouth daily.   diclofenac Sodium (VOLTAREN) 1 % GEL Apply topically 4 (four) times daily.   erythromycin ophthalmic ointment Apply to sutures 4 times a day for 10-12 days.  Discontinue if allergy develops and call our office   losartan-hydrochlorothiazide (HYZAAR) 100-12.5 MG tablet Take 1 tablet by mouth daily.   vitamin B-12 (CYANOCOBALAMIN) 1000 MCG tablet Take 1,000 mcg by mouth daily.   [DISCONTINUED] amLODipine (NORVASC) 5 MG tablet Take 0.5 tablets (2.5 mg total) by mouth daily.   [DISCONTINUED] hydrochlorothiazide (HYDRODIURIL) 25 MG tablet Take 0.5 tablets (12.5 mg total) by mouth daily. Continue with Losartan 100 mg until completed; then new Rx will replace both   [DISCONTINUED] rosuvastatin (CRESTOR) 10 MG tablet TAKE 1 TABLET BY MOUTH 3 TIMES A WEEK.   [DISCONTINUED] traMADol (ULTRAM) 50 MG tablet Take 1 every 4-6 hours as needed for pain not controlled by Tylenol (Patient not taking: Reported on 02/19/2022)   No facility-administered medications prior to visit.    Review of Systems  Constitutional:  Negative for appetite change, chills, fatigue and fever.  Respiratory:  Negative for chest tightness and shortness of breath.   Cardiovascular:  Negative for chest pain and palpitations.  Gastrointestinal:  Negative for abdominal pain, nausea and vomiting.  Neurological:  Negative for dizziness and weakness.    Last CBC Lab Results  Component Value Date   WBC 5.1 11/27/2020   HGB 13.0 11/27/2020   HCT 38.4 11/27/2020    MCV 88 11/27/2020   MCH 29.9 11/27/2020   RDW 13.0 11/27/2020   PLT 241 11/94/1740   Last metabolic panel Lab Results  Component Value Date   GLUCOSE 94 11/24/2021   NA 143 11/24/2021   K 4.1 11/24/2021   CL 103 11/24/2021   CO2 26 11/24/2021   BUN 12 11/24/2021   CREATININE 0.76 11/24/2021   EGFR 85 11/24/2021   CALCIUM 9.8 11/24/2021   PROT 7.3 11/24/2021   ALBUMIN 4.5 11/24/2021   LABGLOB 2.8 11/24/2021   AGRATIO 1.6 11/24/2021   BILITOT 0.5 11/24/2021   ALKPHOS 77 11/24/2021   AST 15 11/24/2021   ALT 9 11/24/2021   Last lipids Lab Results  Component Value Date   CHOL 178 11/24/2021   HDL 65 11/24/2021   LDLCALC 95 11/24/2021   TRIG 102 11/24/2021   CHOLHDL 3.0 05/26/2021   Last hemoglobin A1c Lab Results  Component Value Date   HGBA1C 5.8 (H) 11/24/2021   Last thyroid functions Lab Results  Component Value Date   TSH 2.590 11/24/2021   Last vitamin D Lab Results  Component Value Date   VD25OH 47.9 11/24/2021   Last vitamin B12 and Folate Lab Results  Component Value Date   VITAMINB12 885 11/24/2021       Objective    BP 124/66 (BP Location: Left Arm, Patient Position: Sitting, Cuff Size: Normal)   Pulse 67   Temp 98.8 F (37.1 C) (Oral)   Resp 14   Wt 151 lb (68.5 kg)   SpO2 100% Comment: room air  BMI 25.92 kg/m  BP Readings from Last 3 Encounters:  02/19/22 124/66  01/22/22 133/73  11/24/21 132/60   Wt Readings from Last 3 Encounters:  02/19/22 151 lb (68.5 kg)  01/22/22 153 lb 3.2 oz (69.5 kg)  11/24/21 154 lb (69.9 kg)   SpO2 Readings from Last 3 Encounters:  02/19/22 100%  01/22/22 98%  11/24/21 98%      Physical Exam Vitals and nursing note reviewed.  Constitutional:      General: She is not in acute distress.    Appearance: Normal appearance. She is normal weight. She is not ill-appearing, toxic-appearing or diaphoretic.  HENT:     Head: Normocephalic and atraumatic.  Cardiovascular:     Rate and Rhythm: Normal  rate and regular rhythm.     Pulses: Normal pulses.     Heart sounds: Normal heart sounds. No murmur heard.    No friction rub. No gallop.  Pulmonary:     Effort: Pulmonary effort is normal. No respiratory distress.     Breath sounds: Normal breath sounds. No stridor. No wheezing, rhonchi or rales.  Chest:     Chest wall: No tenderness.  Musculoskeletal:        General: No swelling, tenderness, deformity or signs of injury. Normal range of motion.     Right lower leg: No edema.     Left lower leg: No edema.  Skin:    General: Skin is warm and dry.  Capillary Refill: Capillary refill takes less than 2 seconds.     Coloration: Skin is not jaundiced or pale.     Findings: No bruising, erythema, lesion or rash.  Neurological:     General: No focal deficit present.     Mental Status: She is alert and oriented to person, place, and time. Mental status is at baseline.     Cranial Nerves: No cranial nerve deficit.     Sensory: No sensory deficit.     Motor: No weakness.     Coordination: Coordination normal.  Psychiatric:        Mood and Affect: Mood normal.        Behavior: Behavior normal.        Thought Content: Thought content normal.        Judgment: Judgment normal.     No results found for any visits on 02/19/22.  Assessment & Plan     Problem List Items Addressed This Visit       Cardiovascular and Mediastinum   Benign essential HTN - Primary    Chronic, much improved- stabilized Patient feeling much better and has been more active and eating better as a result Home logs reviewed Continue Losartan 100-HCTZ 12.5 Continue Norvasc 2.5 mg Denies further side effects at this time      Relevant Medications   amLODipine (NORVASC) 2.5 MG tablet   rosuvastatin (CRESTOR) 10 MG tablet     Other   HLD (hyperlipidemia)    Chronic, stable Use of 10 mg crestor 3x/week LP reviewed from May 2023 I recommend diet low in saturated fat and regular exercise - 30 min at least  5 times per week ASCVD risk 10% in 10 years which indicates moderate risk. The 10-year ASCVD risk score (Arnett DK, et al., 2019) is: 9.9%   Values used to calculate the score:     Age: 31 years     Sex: Female     Is Non-Hispanic African American: No     Diabetic: No     Tobacco smoker: No     Systolic Blood Pressure: 825 mmHg     Is BP treated: Yes     HDL Cholesterol: 65 mg/dL     Total Cholesterol: 178 mg/dL       Relevant Medications   amLODipine (NORVASC) 2.5 MG tablet   rosuvastatin (CRESTOR) 10 MG tablet     Return in about 41 weeks (around 12/03/2022) for annual examination.      Vonna Kotyk, FNP, have reviewed all documentation for this visit. The documentation on 02/19/22 for the exam, diagnosis, procedures, and orders are all accurate and complete.    Gwyneth Sprout, [place] [phone] (phone) [phone] (fax)  [place]

## 2022-02-19 NOTE — Assessment & Plan Note (Signed)
Chronic, much improved- stabilized Patient feeling much better and has been more active and eating better as a result Home logs reviewed Continue Losartan 100-HCTZ 12.5 Continue Norvasc 2.5 mg Denies further side effects at this time

## 2022-02-19 NOTE — Assessment & Plan Note (Signed)
Chronic, stable Use of 10 mg crestor 3x/week LP reviewed from May 2023 I recommend diet low in saturated fat and regular exercise - 30 min at least 5 times per week ASCVD risk 10% in 10 years which indicates moderate risk. The 10-year ASCVD risk score (Arnett DK, et al., 2019) is: 9.9%   Values used to calculate the score:     Age: 72 years     Sex: Female     Is Non-Hispanic African American: No     Diabetic: No     Tobacco smoker: No     Systolic Blood Pressure: 368 mmHg     Is BP treated: Yes     HDL Cholesterol: 65 mg/dL     Total Cholesterol: 178 mg/dL

## 2022-03-17 DIAGNOSIS — Z48817 Encounter for surgical aftercare following surgery on the skin and subcutaneous tissue: Secondary | ICD-10-CM | POA: Diagnosis not present

## 2022-05-26 NOTE — Progress Notes (Signed)
I,Sulibeya S Dimas,acting as a Education administrator for Lavon Paganini, MD.,have documented all relevant documentation on the behalf of Lavon Paganini, MD,as directed by  Lavon Paganini, MD while in the presence of Lavon Paganini, MD.     Established patient visit   Patient: Mckenzie Willis   DOB: 30-Oct-1950   71 y.o. Female  MRN: [id number] Visit Date: 05/28/2022  Today's healthcare provider: Lavon Paganini, MD   Chief Complaint  Patient presents with   Hypertension   Hyperlipidemia   Hyperglycemia   Subjective    HPI  Hypertension, follow-up  BP Readings from Last 3 Encounters:  05/28/22 137/79  02/19/22 124/66  01/22/22 133/73   Wt Readings from Last 3 Encounters:  05/28/22 144 lb 8 oz (65.5 kg)  02/19/22 151 lb (68.5 kg)  01/22/22 153 lb 3.2 oz (69.5 kg)     She was last seen for hypertension 3 months ago.  BP at that visit was 124/66. Management since that visit includes no changes. Continue losartan-HCTZ 100-12.5 mg and Norvasc 2.5 mg daily. She reports excellent compliance with treatment. She is not having side effects.  She is exercising. She is adherent to low salt diet.   Outside blood pressures are stable.  She does not smoke.  Use of agents associated with hypertension: none.   --------------------------------------------------------------------------------------------------- Lipid/Cholesterol, follow-up  Last Lipid Panel: Lab Results  Component Value Date   CHOL 178 11/24/2021   LDLCALC 95 11/24/2021   HDL 65 11/24/2021   TRIG 102 11/24/2021    She was last seen for this 3 months ago.  Management since that visit includes continue crestor 10 mg 3x/week.  She reports excellent compliance with treatment. She is not having side effects.   Symptoms: No appetite changes No foot ulcerations  No chest pain No chest pressure/discomfort  No dyspnea No orthopnea  No fatigue No lower extremity edema  No palpitations No paroxysmal nocturnal  dyspnea  No nausea No numbness or tingling of extremity  No polydipsia No polyuria  No speech difficulty No syncope   She is following a Regular diet. Current exercise: walking  Last metabolic panel Lab Results  Component Value Date   GLUCOSE 94 11/24/2021   NA 143 11/24/2021   K 4.1 11/24/2021   BUN 12 11/24/2021   CREATININE 0.76 11/24/2021   EGFR 85 11/24/2021   GFRNONAA 84 05/29/2020   CALCIUM 9.8 11/24/2021   AST 15 11/24/2021   ALT 9 11/24/2021   The 10-year ASCVD risk score (Arnett DK, et al., 2019) is: 11.9%  --------------------------------------------------------------------------------------------------- Prediabetes, Follow-up  Lab Results  Component Value Date   HGBA1C 5.8 (H) 11/24/2021   HGBA1C 5.6 05/26/2021   HGBA1C 6.0 03/17/2015   GLUCOSE 94 11/24/2021   GLUCOSE 93 05/26/2021   GLUCOSE 107 (H) 11/27/2020    Last seen for for this6 months ago.  Management since that visit includes no changes. Current symptoms include none and have been stable.  Prior visit with dietician: no Current diet: in general, a "healthy" diet   Current exercise: walking  Pertinent Labs:    Component Value Date/Time   CHOL 178 11/24/2021 1106   TRIG 102 11/24/2021 1106   CHOLHDL 3.0 05/26/2021 1102   CREATININE 0.76 11/24/2021 1106    Wt Readings from Last 3 Encounters:  05/28/22 144 lb 8 oz (65.5 kg)  02/19/22 151 lb (68.5 kg)  01/22/22 153 lb 3.2 oz (69.5 kg)    ----------------------------------------------------------------------------------------- Urinary symptoms  She reports new onset urinary frequency and  lower abdominal pain . The current episode started  3 weeks ago and is gradually improving. Patient states symptoms are mild in intensity, occurring intermittently. She  has not been recently treated for similar symptoms.    Associated symptoms: Yes abdominal pain Yes back pain  No chills No constipation  Yes cramping No diarrhea  No discharge No  fever  No hematuria No nausea  No vomiting    ---------------------------------------------------------------------------------------   Medications: Outpatient Medications Prior to Visit  Medication Sig   amLODipine (NORVASC) 2.5 MG tablet Take 1 tablet (2.5 mg total) by mouth daily.   Cholecalciferol (VITAMIN D-3) 25 MCG (1000 UT) CAPS Take 1 capsule by mouth daily.   diclofenac Sodium (VOLTAREN) 1 % GEL Apply topically 4 (four) times daily.   erythromycin ophthalmic ointment Apply to sutures 4 times a day for 10-12 days.  Discontinue if allergy develops and call our office   losartan-hydrochlorothiazide (HYZAAR) 100-12.5 MG tablet Take 1 tablet by mouth daily.   rosuvastatin (CRESTOR) 10 MG tablet TAKE 1 TABLET BY MOUTH 3 TIMES A WEEK.   vitamin B-12 (CYANOCOBALAMIN) 1000 MCG tablet Take 1,000 mcg by mouth daily.   No facility-administered medications prior to visit.    Review of Systems  Constitutional:  Negative for appetite change and chills.  Respiratory:  Negative for chest tightness and shortness of breath.   Cardiovascular:  Negative for chest pain, palpitations and leg swelling.  Gastrointestinal:  Positive for abdominal pain. Negative for constipation, diarrhea, nausea and vomiting.  Genitourinary:  Positive for flank pain and frequency. Negative for dysuria, hematuria and vaginal discharge.       Objective    BP 137/79 (BP Location: Left Arm, Patient Position: Sitting, Cuff Size: Large)   Pulse 71   Temp 98 F (36.7 C) (Oral)   Resp 16   Wt 144 lb 8 oz (65.5 kg)   BMI 24.80 kg/m  BP Readings from Last 3 Encounters:  05/28/22 137/79  02/19/22 124/66  01/22/22 133/73   Wt Readings from Last 3 Encounters:  05/28/22 144 lb 8 oz (65.5 kg)  02/19/22 151 lb (68.5 kg)  01/22/22 153 lb 3.2 oz (69.5 kg)      Physical Exam Vitals reviewed.  Constitutional:      General: She is not in acute distress.    Appearance: Normal appearance. She is well-developed. She  is not diaphoretic.  HENT:     Head: Normocephalic and atraumatic.  Eyes:     General: No scleral icterus.    Conjunctiva/sclera: Conjunctivae normal.  Neck:     Thyroid: No thyromegaly.  Cardiovascular:     Rate and Rhythm: Normal rate and regular rhythm.     Pulses: Normal pulses.     Heart sounds: Normal heart sounds. No murmur heard. Pulmonary:     Effort: Pulmonary effort is normal. No respiratory distress.     Breath sounds: Normal breath sounds. No wheezing, rhonchi or rales.  Musculoskeletal:     Cervical back: Neck supple.     Right lower leg: No edema.     Left lower leg: No edema.  Lymphadenopathy:     Cervical: No cervical adenopathy.  Skin:    General: Skin is warm and dry.     Findings: No rash.  Neurological:     Mental Status: She is alert and oriented to person, place, and time. Mental status is at baseline.  Psychiatric:        Mood and Affect: Mood normal.  Behavior: Behavior normal.      Results for orders placed or performed in visit on 05/28/22  POCT urinalysis dipstick  Result Value Ref Range   Color, UA yellow    Clarity, UA clear    Glucose, UA Negative Negative   Bilirubin, UA Negative    Ketones, UA Small    Spec Grav, UA 1.010 1.010 - 1.025   Blood, UA Small    pH, UA 6.0 5.0 - 8.0   Protein, UA Negative Negative   Urobilinogen, UA 0.2 0.2 or 1.0 E.U./dL   Nitrite, UA Negative    Leukocytes, UA Negative Negative    Assessment & Plan     Problem List Items Addressed This Visit       Cardiovascular and Mediastinum   Benign essential HTN - Primary    Well controlled Continue current medications Recheck metabolic panel F/u in 6 months       Relevant Orders   Comprehensive metabolic panel     Genitourinary   Atrophic vaginitis    Resume estrace cream May be the cause urinary symptoms        Other   HLD (hyperlipidemia)    Previously well controlled Continue statin Repeat FLP and CMP      Relevant Orders    Comprehensive metabolic panel   Lipid Panel With LDL/HDL Ratio   Prediabetes    Recommend low carb diet Recheck A1c       Relevant Orders   Hemoglobin A1c   Other Visit Diagnoses     Frequent urination       Relevant Orders   POCT urinalysis dipstick (Completed)   Urine Culture   Hematuria, unspecified type       Relevant Orders   Urinalysis, microscopic only      - Symptoms consistent with UTI - UA fairly benign - with no leuks or nitrites - check urine culture -No systemic symptoms or signs of pyelonephritis - Given hematuria, will send urine micro to confirm and will plan to recheck urine in about 6 weeks after completion of antibiotics to ensure hematuria has cleared -treatment pending UCx results - may be due to atrophic vaginitis as above -Discussed return precautions   Return in about 6 months (around 11/26/2022) for AWV, CPE.      I, Lavon Paganini, MD, have reviewed all documentation for this visit. The documentation on 05/28/22 for the exam, diagnosis, procedures, and orders are all accurate and complete.   Almira Phetteplace, Dionne Bucy, MD, MPH [facility]

## 2022-05-28 ENCOUNTER — Encounter: Payer: Self-pay | Admitting: Family Medicine

## 2022-05-28 ENCOUNTER — Ambulatory Visit (INDEPENDENT_AMBULATORY_CARE_PROVIDER_SITE_OTHER): Payer: PPO | Admitting: Family Medicine

## 2022-05-28 VITALS — BP 137/79 | HR 71 | Temp 98.0°F | Resp 16 | Wt 144.5 lb

## 2022-05-28 DIAGNOSIS — R319 Hematuria, unspecified: Secondary | ICD-10-CM

## 2022-05-28 DIAGNOSIS — I10 Essential (primary) hypertension: Secondary | ICD-10-CM

## 2022-05-28 DIAGNOSIS — E782 Mixed hyperlipidemia: Secondary | ICD-10-CM

## 2022-05-28 DIAGNOSIS — R350 Frequency of micturition: Secondary | ICD-10-CM | POA: Diagnosis not present

## 2022-05-28 DIAGNOSIS — R7303 Prediabetes: Secondary | ICD-10-CM

## 2022-05-28 DIAGNOSIS — Z23 Encounter for immunization: Secondary | ICD-10-CM | POA: Diagnosis not present

## 2022-05-28 DIAGNOSIS — R739 Hyperglycemia, unspecified: Secondary | ICD-10-CM

## 2022-05-28 DIAGNOSIS — N952 Postmenopausal atrophic vaginitis: Secondary | ICD-10-CM | POA: Diagnosis not present

## 2022-05-28 LAB — POCT URINALYSIS DIPSTICK
Bilirubin, UA: NEGATIVE
Glucose, UA: NEGATIVE
Leukocytes, UA: NEGATIVE
Nitrite, UA: NEGATIVE
Protein, UA: NEGATIVE
Spec Grav, UA: 1.01 (ref 1.010–1.025)
Urobilinogen, UA: 0.2 E.U./dL
pH, UA: 6 (ref 5.0–8.0)

## 2022-05-28 MED ORDER — ESTRADIOL 0.1 MG/GM VA CREA: 1.0000 | TOPICAL_CREAM | VAGINAL | 12 refills | Status: DC

## 2022-05-28 NOTE — Assessment & Plan Note (Signed)
Previously well controlled Continue statin Repeat FLP and CMP  

## 2022-05-28 NOTE — Assessment & Plan Note (Signed)
Resume estrace cream May be the cause urinary symptoms

## 2022-05-28 NOTE — Assessment & Plan Note (Signed)
Recommend low carb diet °Recheck A1c  °

## 2022-05-28 NOTE — Assessment & Plan Note (Signed)
Well controlled Continue current medications Recheck metabolic panel F/u in 6 months  

## 2022-05-29 LAB — COMPREHENSIVE METABOLIC PANEL
ALT: 8 IU/L (ref 0–32)
AST: 14 IU/L (ref 0–40)
Albumin/Globulin Ratio: 2.2 (ref 1.2–2.2)
Albumin: 5.1 g/dL — ABNORMAL HIGH (ref 3.9–4.9)
Alkaline Phosphatase: 77 IU/L (ref 44–121)
BUN/Creatinine Ratio: 30 — ABNORMAL HIGH (ref 12–28)
BUN: 27 mg/dL (ref 8–27)
Bilirubin Total: 0.6 mg/dL (ref 0.0–1.2)
CO2: 25 mmol/L (ref 20–29)
Calcium: 9.8 mg/dL (ref 8.7–10.3)
Chloride: 99 mmol/L (ref 96–106)
Creatinine, Ser: 0.91 mg/dL (ref 0.57–1.00)
Globulin, Total: 2.3 g/dL (ref 1.5–4.5)
Glucose: 93 mg/dL (ref 70–99)
Potassium: 4.2 mmol/L (ref 3.5–5.2)
Sodium: 140 mmol/L (ref 134–144)
Total Protein: 7.4 g/dL (ref 6.0–8.5)
eGFR: 68 mL/min/{1.73_m2} (ref >59)

## 2022-05-29 LAB — LIPID PANEL WITH LDL/HDL RATIO
Cholesterol, Total: 195 mg/dL (ref 100–199)
HDL: 81 mg/dL (ref >39)
LDL Chol Calc (NIH): 104 mg/dL — ABNORMAL HIGH (ref 0–99)
LDL/HDL Ratio: 1.3 ratio (ref 0.0–3.2)
Triglycerides: 50 mg/dL (ref 0–149)
VLDL Cholesterol Cal: 10 mg/dL (ref 5–40)

## 2022-05-29 LAB — HEMOGLOBIN A1C
Est. average glucose Bld gHb Est-mCnc: 114 mg/dL
Hgb A1c MFr Bld: 5.6 % (ref 4.8–5.6)

## 2022-05-29 LAB — URINALYSIS, MICROSCOPIC ONLY
Bacteria, UA: NONE SEEN
Casts: NONE SEEN /lpf
Epithelial Cells (non renal): NONE SEEN /hpf (ref 0–10)
RBC, Urine: NONE SEEN /hpf (ref 0–2)

## 2022-05-31 ENCOUNTER — Telehealth: Payer: Self-pay

## 2022-05-31 NOTE — Telephone Encounter (Signed)
Patient was advised of lab results. Patient just wants Dr. B know that she has a history of kidney stone and if that can be a possibility from where the blood is coming from? She also stated that she would like to work on her diet before deciding on doing the statin daily. Reports that right know she is going to stay with Crestor 10 mg 1 tablet 3 times a week.

## 2022-05-31 NOTE — Telephone Encounter (Signed)
There actually wasn't any blood on recheck under the microscope - so nothing to worry about there. Sounds good about the statin

## 2022-05-31 NOTE — Telephone Encounter (Signed)
Patient advised as below.  

## 2022-06-01 LAB — URINE CULTURE

## 2022-07-01 DIAGNOSIS — Z85828 Personal history of other malignant neoplasm of skin: Secondary | ICD-10-CM | POA: Diagnosis not present

## 2022-07-01 DIAGNOSIS — L578 Other skin changes due to chronic exposure to nonionizing radiation: Secondary | ICD-10-CM | POA: Diagnosis not present

## 2022-07-01 DIAGNOSIS — Z872 Personal history of diseases of the skin and subcutaneous tissue: Secondary | ICD-10-CM | POA: Diagnosis not present

## 2022-07-01 DIAGNOSIS — L853 Xerosis cutis: Secondary | ICD-10-CM | POA: Diagnosis not present

## 2022-07-21 ENCOUNTER — Other Ambulatory Visit: Payer: Self-pay | Admitting: Family Medicine

## 2022-10-29 IMAGING — US US THYROID
1 series · 12 of 25 positions shown · non-contrast
Comparison: 11/26/2011

CLINICAL DATA: 69-year-old female with a history of thyroid nodules

EXAM:
THYROID ULTRASOUND
TECHNIQUE: Ultrasound examination of the thyroid gland and adjacent soft
tissues was performed.

[Series 1: us thyroid · 0.07mm/px · 12 of 49 slices shown]
[im 3/49]
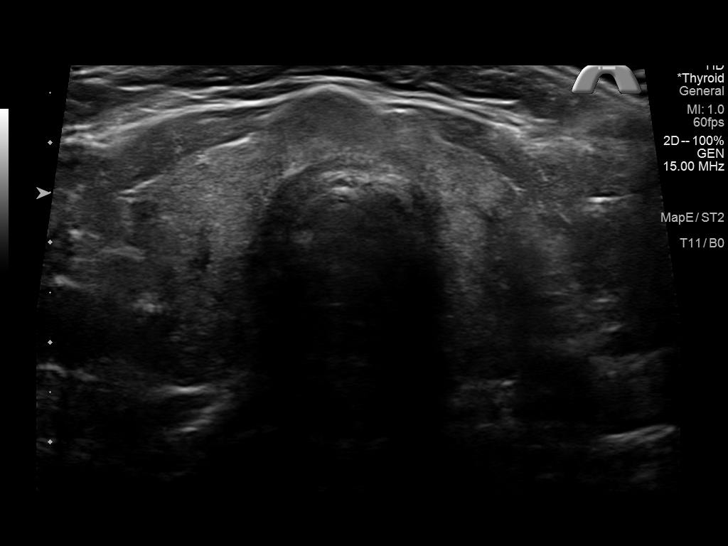
[im 7/49]
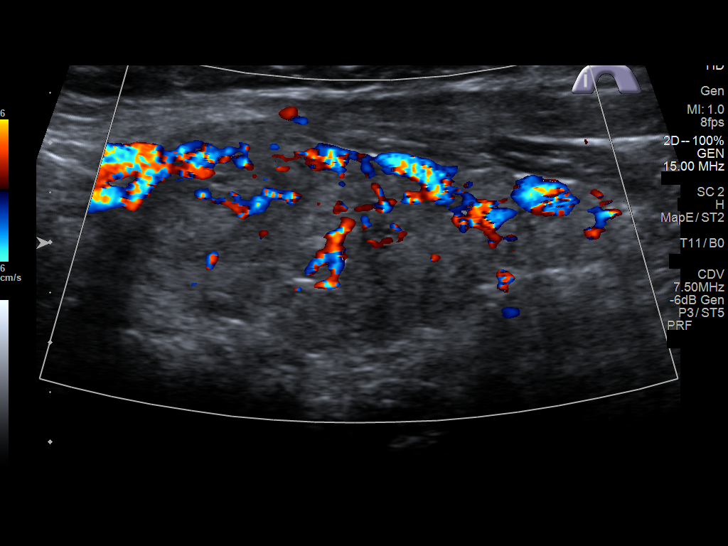
[im 11/49]
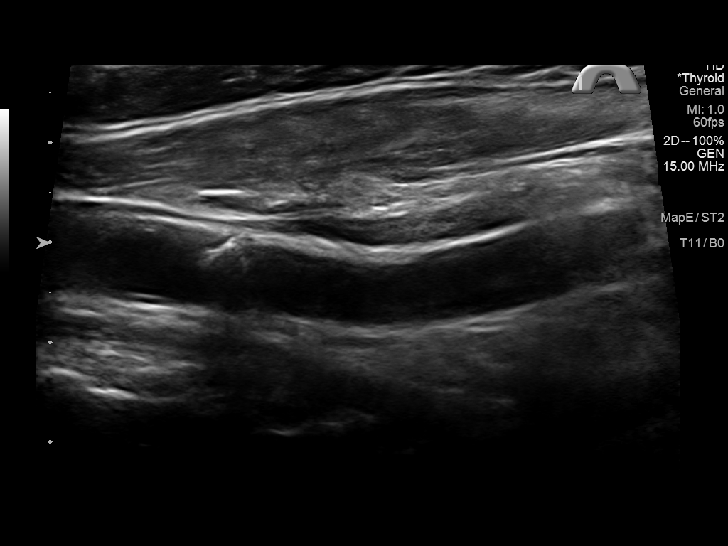
[im 15/49]
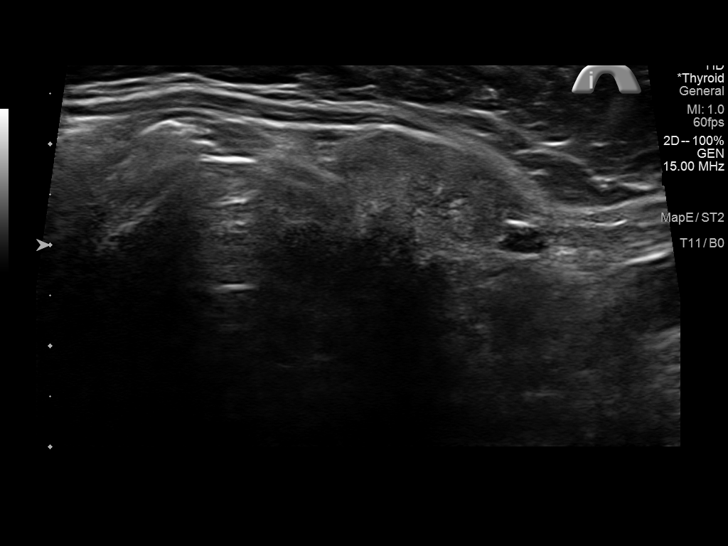
[im 19/49]
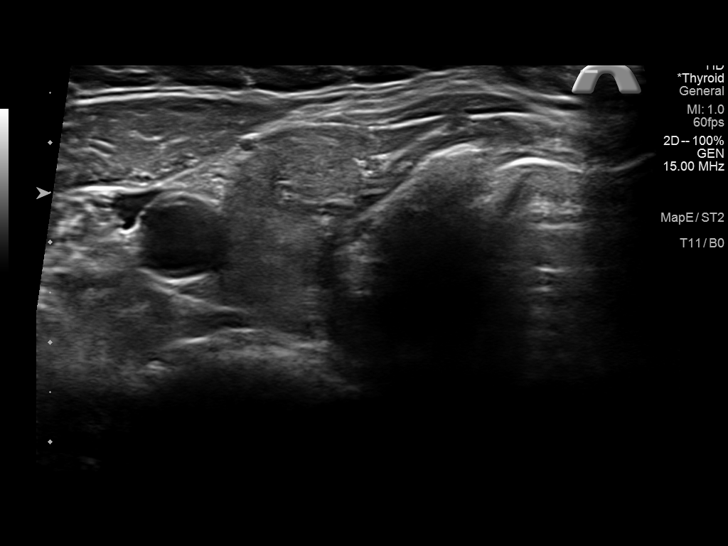
[im 23/49]
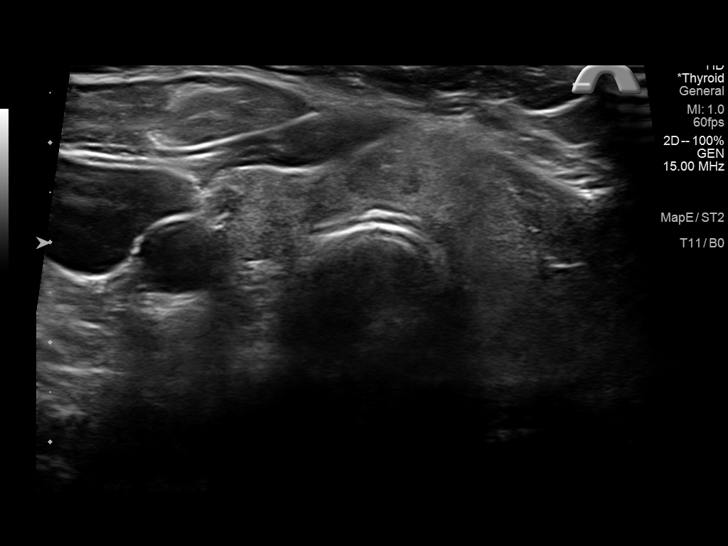
[im 27/49]
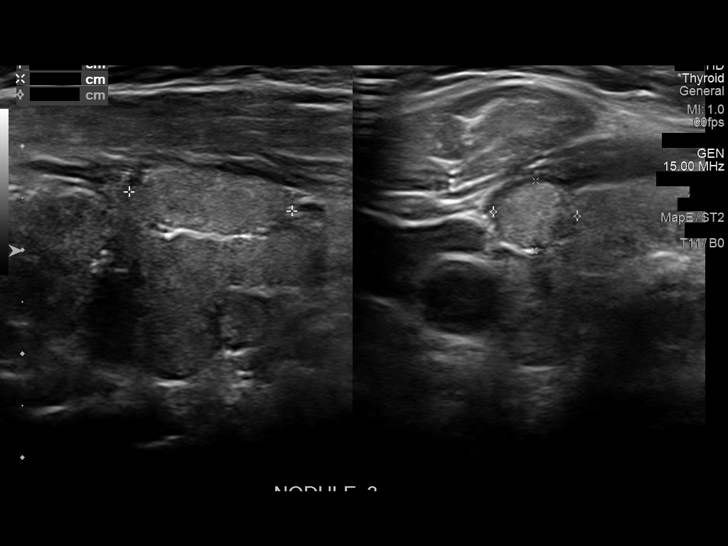
[im 31/49]
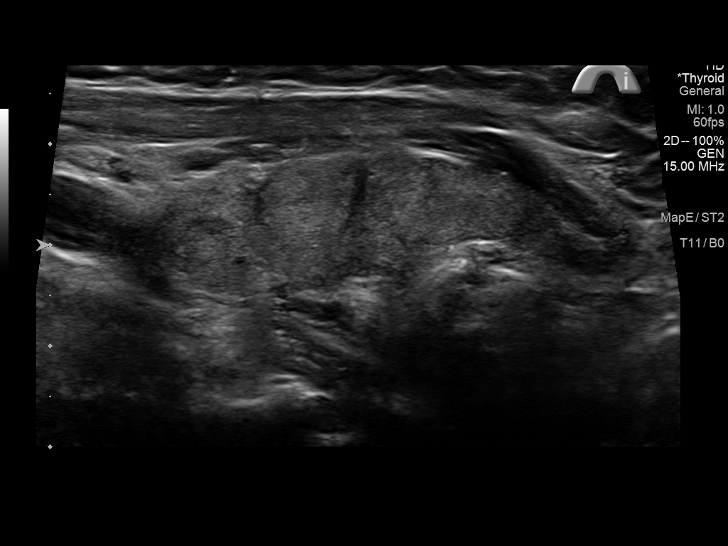
[im 35/49]
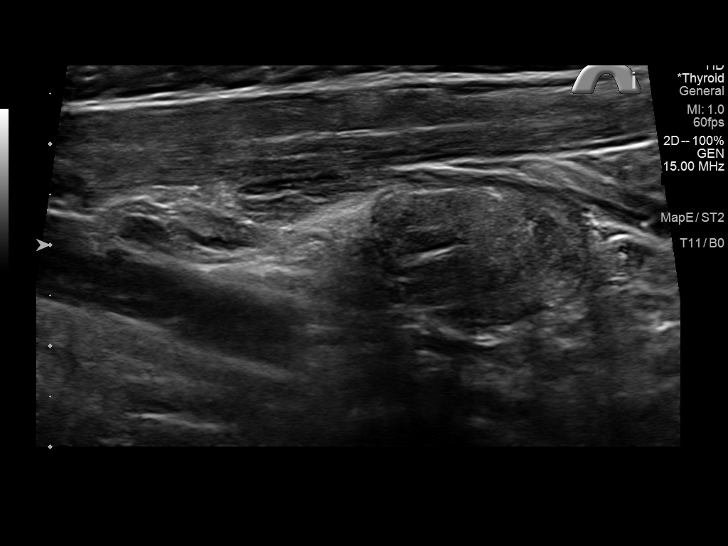
[im 39/49]
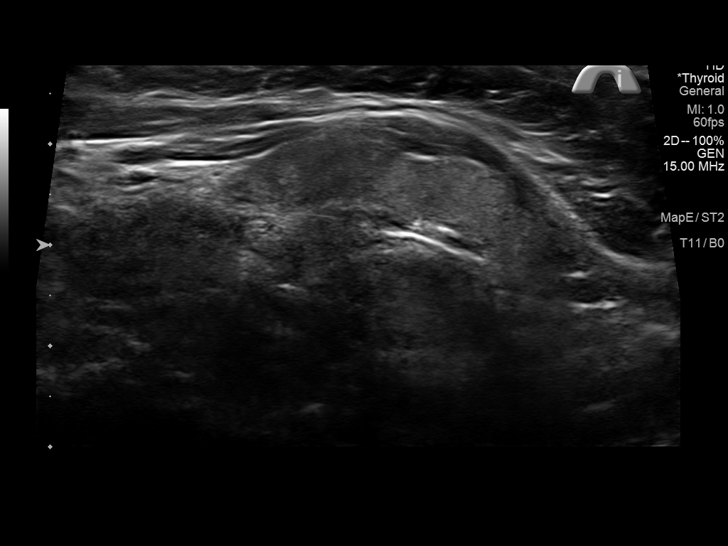
[im 43/49]
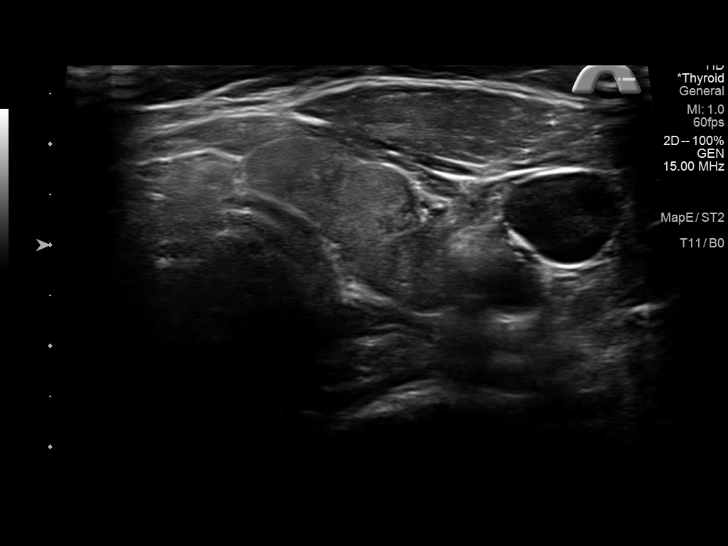
[im 47/49]
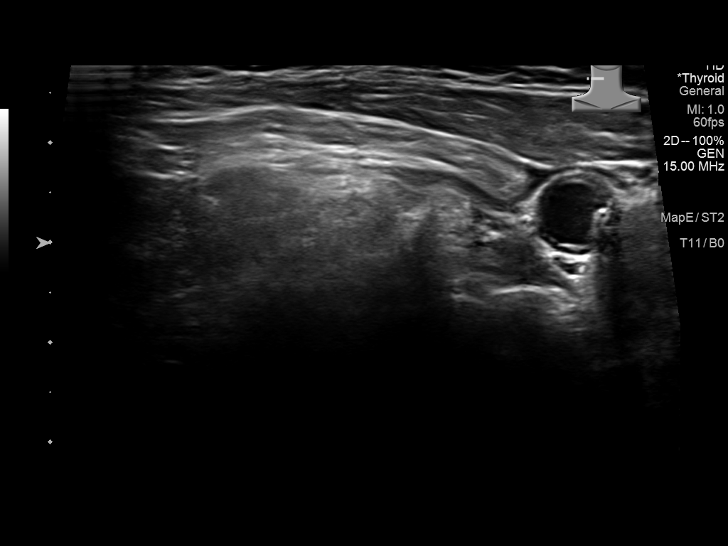

[12 of 25 positions shown; findings below may reference images not displayed]

FINDINGS: Parenchymal Echotexture: Moderately heterogenous

Isthmus: 0.7 cm

Right lobe: 5.0 cm x 2.1 cm x 1.8 cm

Left lobe: 4.0 cm x 1.3 cm x 1.8 cm

_________________________________________________________

Estimated total number of nodules >/= 1 cm: 3

Number of spongiform nodules >/=  2 cm not described below (TR1): 0

Number of mixed cystic and solid nodules >/= 1.5 cm not described
below (TR2): 0

_________________________________________________________

Nodule # 1:

Location: Right; Superior

Maximum size: 1.1 cm; Other 2 dimensions: 0.7 cm x 1.0 cm

Composition: cannot determine (2)

Echogenicity: isoechoic (1)

Shape: not taller-than-wide (0)

Margins: ill-defined (0)

Echogenic foci: none (0)

ACR TI-RADS total points: 3.

ACR TI-RADS risk category: TR3 (3 points).

ACR TI-RADS recommendations:

Nodule does not meet criteria for surveillance or biopsy

_________________________________________________________

Nodule # 2:

Location: Right; Mid

Maximum size: 0.9 cm; Other 2 dimensions: 0.8 cm x 0.7 cm

Composition: solid/almost completely solid (2)

Echogenicity: isoechoic (1)

Shape: not taller-than-wide (0)

Margins: ill-defined (0)

Echogenic foci: peripheral calcifications (2)

ACR TI-RADS total points: 5.

ACR TI-RADS risk category: TR4 (4-6 points).

ACR TI-RADS recommendations:

Nodule does not meet criteria for surveillance or biopsy

_________________________________________________________

Nodule # 3:

Location: Right; Inferior

Maximum size: 1.6 cm; Other 2 dimensions: 0.8 cm x 0.7 cm

Composition: solid/almost completely solid (2)

Echogenicity: isoechoic (1)

Shape: not taller-than-wide (0)

Margins: ill-defined (0)

Echogenic foci: none (0)

ACR TI-RADS total points: 3.

ACR TI-RADS risk category: TR3 (3 points).

ACR TI-RADS recommendations:

Nodule meets criteria for surveillance

_________________________________________________________

Nodule # 4:

Location: Right; Inferior

Maximum size: 0.8 cm; Other 2 dimensions: 0.8 cm x 0.8 cm

Composition: solid/almost completely solid (2)

Echogenicity: hypoechoic (2)

Shape: not taller-than-wide (0)

Margins: smooth (0)

Echogenic foci: none (0)

ACR TI-RADS total points: 4.

ACR TI-RADS risk category: TR4 (4-6 points).

ACR TI-RADS recommendations:

Nodule does not meet criteria for surveillance or biopsy

_________________________________________________________

Nodule # 5:

Location: Left; Inferior

Maximum size: 1.9 cm; Other 2 dimensions: 1.5 cm x 1.8 cm

Composition: solid/almost completely solid (2)

Echogenicity: isoechoic (1)

Shape: not taller-than-wide (0)

Margins: smooth (0)

Echogenic foci: none (0)

ACR TI-RADS total points: 3.

ACR TI-RADS risk category: TR3 (3 points).

ACR TI-RADS recommendations:

Nodule meets criteria for surveillance

_________________________________________________________

No adenopathy
IMPRESSION: Multinodular thyroid.

Right inferior thyroid nodule (labeled 3, 1.6 cm) and the left
inferior thyroid nodule (labeled 5, 1.9 cm) both meet criteria for
surveillance, as designated by the newly established ACR TI-RADS
criteria. Surveillance ultrasound study recommended to be performed
annually up to 5 years.

Recommendations follow those established by the new ACR TI-RADS
criteria ([HOSPITAL] 7915;[DATE]).

## 2022-12-02 ENCOUNTER — Encounter: Payer: Self-pay | Admitting: Family Medicine

## 2022-12-02 ENCOUNTER — Ambulatory Visit (INDEPENDENT_AMBULATORY_CARE_PROVIDER_SITE_OTHER): Payer: PPO | Admitting: Family Medicine

## 2022-12-02 VITALS — BP 132/63 | HR 65 | Temp 97.8°F | Resp 12 | Ht 64.5 in | Wt 149.9 lb

## 2022-12-02 DIAGNOSIS — R7303 Prediabetes: Secondary | ICD-10-CM

## 2022-12-02 DIAGNOSIS — E782 Mixed hyperlipidemia: Secondary | ICD-10-CM | POA: Diagnosis not present

## 2022-12-02 DIAGNOSIS — I10 Essential (primary) hypertension: Secondary | ICD-10-CM | POA: Diagnosis not present

## 2022-12-02 NOTE — Assessment & Plan Note (Signed)
Chronic and well controlled.  Continue managing with diet and exercise. Ordered Hgb A1c.

## 2022-12-02 NOTE — Assessment & Plan Note (Signed)
Chronic and well controlled. Continue current medications. Ordered CMP and Lipid panel.

## 2022-12-02 NOTE — Progress Notes (Signed)
   Established Patient Office Visit  Subjective   Patient ID: Mckenzie Willis, female    DOB: [birth year]  Age: 72 y.o. MRN: [id number]  HPI Hypertension Last seen for hypertension 6 months ago. Morning systolic blood pressure is 150 mmHg, lowers to 130 mmHg after taking her BP medication. Is no longer having episodes of hypotension since lowering her amlodipine to 2.5 mg daily.   Hyperlipidemia Last seen for hyperlipidemia 6 months ago Is taking Crestor 3 times a week. Is not experiencing any side effects.   Prediabetes Last seen for prediabetes 6 months ago Managed with diet and exercise.     Review of Systems  Respiratory:  Negative for shortness of breath.   Cardiovascular:  Negative for chest pain and leg swelling.  Musculoskeletal:  Negative for myalgias.  Neurological:  Negative for dizziness and headaches.      Objective:     BP 132/63 (BP Location: Left Arm, Patient Position: Sitting, Cuff Size: Normal)   Pulse 65   Temp 97.8 F (36.6 C) (Temporal)   Resp 12   Ht 5' 4.5" (1.638 m)   Wt 149 lb 14.4 oz (68 kg)   SpO2 99%   BMI 25.33 kg/m  BP Readings from Last 3 Encounters:  12/02/22 132/63  05/28/22 137/79  02/19/22 124/66   Wt Readings from Last 3 Encounters:  12/02/22 149 lb 14.4 oz (68 kg)  05/28/22 144 lb 8 oz (65.5 kg)  02/19/22 151 lb (68.5 kg)      Physical Exam Constitutional:      General: She is not in acute distress. HENT:     Head: Normocephalic and atraumatic.  Cardiovascular:     Pulses: Normal pulses.     Heart sounds: Normal heart sounds.  Pulmonary:     Effort: Pulmonary effort is normal.     Breath sounds: Normal breath sounds.  Musculoskeletal:     Cervical back: Normal range of motion.  Skin:    General: Skin is warm and dry.  Neurological:     Mental Status: She is alert.      No results found for any visits on 12/02/22.    The 10-year ASCVD risk score (Arnett DK, et al., 2019) is: 12.3%    Assessment &  Plan:   Problem List Items Addressed This Visit     Benign essential HTN - Primary    Chronic and well controlled.  Continue current medications.  Ordered CMP.       Relevant Orders   Comprehensive metabolic panel   HLD (hyperlipidemia)    Chronic and well controlled. Continue current medications. Ordered CMP and Lipid panel.      Relevant Orders   Lipid panel   Comprehensive metabolic panel   Prediabetes    Chronic and well controlled.  Continue managing with diet and exercise. Ordered Hgb A1c.       Relevant Orders   Hemoglobin A1c    Return in about 6 months (around 06/04/2023) for AWV, CPE.    Gilmer Mor, Medical Student  Patient seen along with MS3 student Ezekiel Slocumb. I personally evaluated this patient along with the student, and verified all aspects of the history, physical exam, and medical decision making as documented by the student. I agree with the student's documentation and have made all necessary edits.  Gurpreet Mikhail, Marzella Schlein, MD, MPH [facility]

## 2022-12-02 NOTE — Assessment & Plan Note (Signed)
Chronic and well controlled.  Continue current medications.  Ordered CMP.

## 2022-12-03 LAB — COMPREHENSIVE METABOLIC PANEL
ALT: 10 IU/L (ref 0–32)
AST: 16 IU/L (ref 0–40)
Albumin/Globulin Ratio: 1.8 (ref 1.2–2.2)
Albumin: 4.6 g/dL (ref 3.9–4.9)
Alkaline Phosphatase: 72 IU/L (ref 44–121)
BUN/Creatinine Ratio: 22 (ref 12–28)
BUN: 16 mg/dL (ref 8–27)
Bilirubin Total: 0.8 mg/dL (ref 0.0–1.2)
CO2: 24 mmol/L (ref 20–29)
Calcium: 10 mg/dL (ref 8.7–10.3)
Chloride: 102 mmol/L (ref 96–106)
Creatinine, Ser: 0.74 mg/dL (ref 0.57–1.00)
Globulin, Total: 2.6 g/dL (ref 1.5–4.5)
Glucose: 96 mg/dL (ref 70–99)
Potassium: 4.1 mmol/L (ref 3.5–5.2)
Sodium: 141 mmol/L (ref 134–144)
Total Protein: 7.2 g/dL (ref 6.0–8.5)
eGFR: 87 mL/min/{1.73_m2} (ref >59)

## 2022-12-03 LAB — LIPID PANEL
Chol/HDL Ratio: 2.7 ratio (ref 0.0–4.4)
Cholesterol, Total: 190 mg/dL (ref 100–199)
HDL: 71 mg/dL (ref >39)
LDL Chol Calc (NIH): 108 mg/dL — ABNORMAL HIGH (ref 0–99)
Triglycerides: 59 mg/dL (ref 0–149)
VLDL Cholesterol Cal: 11 mg/dL (ref 5–40)

## 2022-12-03 LAB — HEMOGLOBIN A1C
Est. average glucose Bld gHb Est-mCnc: 120 mg/dL
Hgb A1c MFr Bld: 5.8 % — ABNORMAL HIGH (ref 4.8–5.6)

## 2022-12-13 ENCOUNTER — Other Ambulatory Visit: Payer: Self-pay | Admitting: Family Medicine

## 2022-12-13 DIAGNOSIS — Z1231 Encounter for screening mammogram for malignant neoplasm of breast: Secondary | ICD-10-CM

## 2023-01-04 DIAGNOSIS — Z85828 Personal history of other malignant neoplasm of skin: Secondary | ICD-10-CM | POA: Diagnosis not present

## 2023-01-04 DIAGNOSIS — L578 Other skin changes due to chronic exposure to nonionizing radiation: Secondary | ICD-10-CM | POA: Diagnosis not present

## 2023-01-04 DIAGNOSIS — L853 Xerosis cutis: Secondary | ICD-10-CM | POA: Diagnosis not present

## 2023-01-04 DIAGNOSIS — Z872 Personal history of diseases of the skin and subcutaneous tissue: Secondary | ICD-10-CM | POA: Diagnosis not present

## 2023-01-04 DIAGNOSIS — L570 Actinic keratosis: Secondary | ICD-10-CM | POA: Diagnosis not present

## 2023-01-10 ENCOUNTER — Other Ambulatory Visit: Payer: Self-pay | Admitting: Family Medicine

## 2023-01-10 DIAGNOSIS — I10 Essential (primary) hypertension: Secondary | ICD-10-CM

## 2023-01-25 ENCOUNTER — Ambulatory Visit (INDEPENDENT_AMBULATORY_CARE_PROVIDER_SITE_OTHER): Payer: PPO

## 2023-01-25 ENCOUNTER — Ambulatory Visit
Admission: RE | Admit: 2023-01-25 | Discharge: 2023-01-25 | Disposition: A | Discharge status: Home / Self Care | Payer: PPO | Source: Ambulatory Visit | Attending: Family Medicine | Admitting: Family Medicine

## 2023-01-25 VITALS — Ht 64.5 in | Wt 149.0 lb

## 2023-01-25 DIAGNOSIS — Z0000 Encounter for general adult medical examination without abnormal findings: Secondary | ICD-10-CM | POA: Diagnosis not present

## 2023-01-25 DIAGNOSIS — Z1231 Encounter for screening mammogram for malignant neoplasm of breast: Secondary | ICD-10-CM | POA: Insufficient documentation

## 2023-01-25 NOTE — Progress Notes (Signed)
Subjective:   Mckenzie Willis is a 72 y.o. female who presents for Medicare Annual (Subsequent) preventive examination.  Visit Complete: Virtual  I connected with  Mckenzie Willis on 01/25/23 by a audio enabled telemedicine application and verified that I am speaking with the correct person using two identifiers.  Patient Location: Home  Provider Location: Home Office  I discussed the limitations of evaluation and management by telemedicine. The patient expressed understanding and agreed to proceed.  Patient Medicare AWV questionnaire was completed by the patient on (not done); I have confirmed that all information answered by patient is correct and no changes since this date.  Review of Systems    Cardiac Risk Factors include: advanced age (>45men, >28 women);dyslipidemia;hypertension    Objective:    Today's Vitals   01/25/23 1111  Weight: 149 lb (67.6 kg)  Height: 5' 4.5" (1.638 m)   Body mass index is 25.18 kg/m.     01/25/2023   11:34 AM 06/25/2021   11:03 AM 01/11/2020   10:44 AM  Advanced Directives  Does Patient Have a Medical Advance Directive? No No No  Would patient like information on creating a medical advance directive?  No - Patient declined     Current Medications (verified) Outpatient Encounter Medications as of 01/25/2023  Medication Sig   amLODipine (NORVASC) 2.5 MG tablet TAKE 1 TABLET BY MOUTH EVERY DAY   Cholecalciferol (VITAMIN D-3) 25 MCG (1000 UT) CAPS Take 1 capsule by mouth daily.   diclofenac Sodium (VOLTAREN) 1 % GEL Apply topically 4 (four) times daily.   losartan-hydrochlorothiazide (HYZAAR) 100-12.5 MG tablet TAKE 1 TABLET BY MOUTH EVERY DAY   rosuvastatin (CRESTOR) 10 MG tablet TAKE 1 TABLET BY MOUTH 3 TIMES A WEEK.   No facility-administered encounter medications on file as of 01/25/2023.    Allergies (verified) Patient has no known allergies.   History: Past Medical History:  Diagnosis Date   Arthritis    Back pain,  chronic    History of chicken pox    History of measles    History of mumps    Hyperlipidemia    Hypertension    Plantar fasciitis    Past Surgical History:  Procedure Laterality Date   BROW LIFT Bilateral 06/25/2021   Procedure: BLEPHAROPLASTY UPPER EYELID; W/ EXCESS SKIN BILATERAL;  Surgeon: Mckenzie Willis;  Location: [facility];  Service: Ophthalmology;  Laterality: Bilateral;  bilateral upper eye lids   COLONOSCOPY WITH PROPOFOL     COLONOSCOPY WITH PROPOFOL N/A 01/11/2020   Procedure: COLONOSCOPY WITH PROPOFOL;  Surgeon: Mckenzie Willis;  Location: [facility];  Service: Endoscopy;  Laterality: N/A;   TOE SURGERY  2018   Dr Mckenzie Willis   TUBAL LIGATION     Family History  Problem Relation Age of Onset   Hypertension Mother    Heart disease Mother        s/p CABG in 30s   Colon polyps Mother    Hypertension Brother    Cancer Brother        Melanoma    Hypertension Maternal Grandmother    Breast cancer Paternal Aunt 43   Emphysema Father    Diabetes Paternal Grandfather    Breast cancer Maternal Aunt        great aunt   Colon cancer Neg Hx    Social History   Socioeconomic History   Marital status: Married    Spouse name: Not on file   Number of children: 3  Years of education: Not on file   Highest education level: Not on file  Occupational History   Occupation: retired [employer]  Tobacco Use   Smoking status: Former    Packs/day: 0.75    Years: 10.00    Additional pack years: 0.00    Total pack years: 7.50    Types: Cigarettes    Quit date: 78    Years since quitting: 41.5   Smokeless tobacco: Never  Vaping Use   Vaping Use: Never used  Substance and Sexual Activity   Alcohol use: Yes    Alcohol/week: 1.0 - 4.0 standard drink of alcohol    Types: 1 - 4 Glasses of wine per week   Drug use: No   Sexual activity: Not Currently  Other Topics Concern   Not on file  Social History Narrative   Lives in [place] with husband and mother.  Has 2 dogs in home.      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      Diet - regular diet, limited sugar, increased protein      Exercise - none at present   Social Determinants of Corporate investment banker Strain: Low Risk  (01/25/2023)   Overall Financial Resource Strain (CARDIA)    Difficulty of Paying Living Expenses: Not hard at all  Food Insecurity: No Food Insecurity (01/25/2023)   Hunger Vital Sign    Worried About Running Out of Food in the Last Year: Never true    Ran Out of Food in the Last Year: Never true  Transportation Needs: No Transportation Needs (01/25/2023)   PRAPARE - Administrator, Civil Service (Medical): No    Lack of Transportation (Non-Medical): No  Physical Activity: Sufficiently Active (01/25/2023)   Exercise Vital Sign    Days of Exercise per Week: 4 days    Minutes of Exercise per Session: 40 min  Stress: No Stress Concern Present (01/25/2023)   [employer] of Occupational Health - Occupational Stress Questionnaire    Feeling of Stress : Not at all  Social Connections: Socially Integrated (01/25/2023)   Social Connection and Isolation Panel [NHANES]    Frequency of Communication with Friends and Family: More than three times a week    Frequency of Social Gatherings with Friends and Family: Twice a week    Attends Religious Services: More than 4 times per year    Active Member of [employer] or Organizations: Yes    Attends Banker Meetings: Never    Marital Status: Married    Tobacco Counseling Counseling given: Not Answered   Clinical Intake:  Pre-visit preparation completed: Yes  Pain : No/denies pain   BMI - recorded: 25.18 Nutritional Status: BMI 25 -29 Overweight Nutritional Risks: None Diabetes: No  How often do you need to have someone help you when you read instructions, pamphlets, or other written materials from your doctor or pharmacy?: 1 - Never  Interpreter Needed?: No  Comments: lives with spouse Information entered by  :: B.Gabreal Worton,LPN   Activities of Daily Living    01/25/2023   11:35 AM 12/02/2022   10:34 AM  In your present state of health, do you have any difficulty performing the following activities:  Hearing? 0 0  Vision? 0 0  Difficulty concentrating or making decisions? 0 0  Walking or climbing stairs? 0 0  Dressing or bathing? 0 0  Doing errands, shopping? 0 0  Preparing Food and eating ? N   Using the Toilet? N  In the past six months, have you accidently leaked urine? N   Do you have problems with loss of bowel control? N   Managing your Medications? N   Managing your Finances? N   Housekeeping or managing your Housekeeping? N     Patient Care Team: Erasmo Downer, Willis as PCP - General (Family Medicine)  Indicate any recent Medical Services you may have received from other than [facility] providers in the past year (date may be approximate).     Assessment:   This is a routine wellness examination for Aliene.  Hearing/Vision screen Hearing Screening - Comments:: Adequate hearing Vision Screening - Comments:: Adequate vision w/glasses Dr Senaida Ores  Dietary issues and exercise activities discussed:     Goals Addressed   None    Depression Screen    01/25/2023   11:20 AM 12/02/2022   10:34 AM 05/28/2022   10:22 AM 01/22/2022    1:15 PM 11/24/2021   10:27 AM 05/26/2021   10:34 AM 11/19/2020    1:54 PM  PHQ 2/9 Scores  PHQ - 2 Score 0 0 0 0 0 0 0  PHQ- 9 Score  0 0 1 0 0 0    Fall Risk    01/25/2023   11:13 AM 12/02/2022   10:34 AM 05/28/2022   10:22 AM 01/22/2022    1:15 PM 11/24/2021   10:26 AM  Fall Risk   Falls in the past year? 1 0 0 0 0  Number falls in past yr: 0 0 0 0 0  Injury with Fall? 0 0 0 0 0  Risk for fall due to :  No Fall Risks No Fall Risks  No Fall Risks  Follow up Education provided;Falls prevention discussed Falls evaluation completed Falls evaluation completed  Falls evaluation completed    MEDICARE RISK AT HOME:  Medicare Risk at Home - 01/25/23  1115     Any stairs in or around the home? Yes    If so, are there any without handrails? Yes    Home free of loose throw rugs in walkways, pet beds, electrical cords, etc? Yes    Adequate lighting in your home to reduce risk of falls? Yes    Life alert? No    Use of a cane, walker or w/c? No    Grab bars in the bathroom? Yes    Shower chair or bench in shower? Yes    Elevated toilet seat or a handicapped toilet? Yes             TIMED UP AND GO:  Was the test performed?  No    Cognitive Function:        01/25/2023   11:43 AM 11/24/2021   10:27 AM 11/08/2019    9:03 AM  6CIT Screen  What Year? 0 points 0 points 0 points  What month? 0 points 0 points 0 points  What time? 0 points 0 points 0 points  Count back from 20 0 points 0 points 0 points  Months in reverse 0 points 0 points 2 points  Repeat phrase 0 points 0 points 0 points  Total Score 0 points 0 points 2 points    Immunizations Immunization History  Administered Date(s) Administered   Fluad Quad(high Dose 65+) 05/28/2022   Influenza-Unspecified 06/26/2015    TDAP status: Due, Education has been provided regarding the importance of this vaccine. Advised may receive this vaccine at local pharmacy or Health Dept. Aware to provide a copy of the  vaccination record if obtained from local pharmacy or Health Dept. Verbalized acceptance and understanding.  Flu Vaccine status: Up to date  Pneumococcal vaccine status: Due, Education has been provided regarding the importance of this vaccine. Advised may receive this vaccine at local pharmacy or Health Dept. Aware to provide a copy of the vaccination record if obtained from local pharmacy or Health Dept. Verbalized acceptance and understanding.  Covid-19 vaccine status: Declined, Education has been provided regarding the importance of this vaccine but patient still declined. Advised may receive this vaccine at local pharmacy or Health Dept.or vaccine clinic. Aware to  provide a copy of the vaccination record if obtained from local pharmacy or Health Dept. Verbalized acceptance and understanding.  Qualifies for Shingles Vaccine? Yes   Zostavax completed No   Shingrix Completed?: No.    Education has been provided regarding the importance of this vaccine. Patient has been advised to call insurance company to determine out of pocket expense if they have not yet received this vaccine. Advised may also receive vaccine at local pharmacy or Health Dept. Verbalized acceptance and understanding.  Screening Tests Health Maintenance  Topic Date Due   COVID-19 Vaccine (1) Never done   DTaP/Tdap/Td (1 - Tdap) Never done   Zoster Vaccines- Shingrix (1 of 2) Never done   Pneumonia Vaccine 63+ Years old (1 of 1 - PCV) Never done   INFLUENZA VACCINE  02/24/2023   MAMMOGRAM  01/21/2024   Medicare Annual Wellness (AWV)  01/25/2024   Colonoscopy  01/10/2025   DEXA SCAN  Completed   Hepatitis C Screening  Completed   HPV VACCINES  Aged Out    Health Maintenance  Health Maintenance Due  Topic Date Due   COVID-19 Vaccine (1) Never done   DTaP/Tdap/Td (1 - Tdap) Never done   Zoster Vaccines- Shingrix (1 of 2) Never done   Pneumonia Vaccine 1+ Years old (1 of 1 - PCV) Never done    Colorectal cancer screening: Type of screening: Colonoscopy. Completed yes. Repeat every 5-10 years  Mammogram status: Completed yes. Repeat every year  Bone Density status: Completed yes. Results reflect: Bone density results: OSTEOPENIA. Repeat every 3-5 years.  Lung Cancer Screening: (Low Dose CT Chest recommended if Age 50-80 years, 20 pack-year currently smoking OR have quit w/in 15years.) does not qualify.   Lung Cancer Screening Referral: no  Additional Screening:  Hepatitis C Screening: does not qualify; Completed yes  Vision Screening: Recommended annual ophthalmology exams for early detection of glaucoma and other disorders of the eye. Is the patient up to date with  their annual eye exam?  Yes  Who is the provider or what is the name of the office in which the patient attends annual eye exams? Dr Senaida Ores If pt is not established with a provider, would they like to be referred to a provider to establish care? No .   Dental Screening: Recommended annual dental exams for proper oral hygiene  Diabetic Foot Exam: n/a  Community Resource Referral / Chronic Care Management: CRR required this visit?  No   CCM required this visit?  No    Plan:     I have personally reviewed and noted the following in the patient's chart:   Medical and social history Use of alcohol, tobacco or illicit drugs  Current medications and supplements including opioid prescriptions. Patient is not currently taking opioid prescriptions. Functional ability and status Nutritional status Physical activity Advanced directives List of other physicians Hospitalizations, surgeries, and ER visits in  previous 12 months Vitals Screenings to include cognitive, depression, and falls Referrals and appointments  In addition, I have reviewed and discussed with patient certain preventive protocols, quality metrics, and best practice recommendations. A written personalized care plan for preventive services as well as general preventive health recommendations were provided to patient.     Sue Lush, LPN   11/26/6142   After Visit Summary: (Declined) Due to this being a telephonic visit, with patients personalized plan was offered to patient but patient Declined AVS at this time   Nurse Notes: The patient states she is doing well and has no concerns or questions at this time.  *Advanced Directive paperwork mailed to pt per request.

## 2023-01-25 NOTE — Patient Instructions (Addendum)
[name] , Thank you for taking time to come for your Medicare Wellness Visit. I appreciate your ongoing commitment to your health goals. Please review the following plan we discussed and let me know if I can assist you in the future.   These are the goals we discussed:  Goals   None     This is a list of the screening recommended for you and due dates:  Health Maintenance  Topic Date Due   COVID-19 Vaccine (1) Never done   DTaP/Tdap/Td vaccine (1 - Tdap) Never done   Zoster (Shingles) Vaccine (1 of 2) Never done   Pneumonia Vaccine (1 of 1 - PCV) Never done   Flu Shot  02/24/2023   Mammogram  01/21/2024   Medicare Annual Wellness Visit  01/25/2024   Colon Cancer Screening  01/10/2025   DEXA scan (bone density measurement)  Completed   Hepatitis C Screening  Completed   HPV Vaccine  Aged Out    Advanced directives: no mailed to pt  Conditions/risks identified: low falls risk  Next appointment: Follow up in one year for your annual wellness visit 01/30/2024 @ 10:15 am telephone   Preventive Care 65 Years and Older, Female Preventive care refers to lifestyle choices and visits with your health care provider that can promote health and wellness. What does preventive care include? A yearly physical exam. This is also called an annual well check. Dental exams once or twice a year. Routine eye exams. Ask your health care provider how often you should have your eyes checked. Personal lifestyle choices, including: Daily care of your teeth and gums. Regular physical activity. Eating a healthy diet. Avoiding tobacco and drug use. Limiting alcohol use. Practicing safe sex. Taking low-dose aspirin every day. Taking vitamin and mineral supplements as recommended by your health care provider. What happens during an annual well check? The services and screenings done by your health care provider during your annual well check will depend on your age, overall health, lifestyle risk  factors, and family history of disease. Counseling  Your health care provider may ask you questions about your: Alcohol use. Tobacco use. Drug use. Emotional well-being. Home and relationship well-being. Sexual activity. Eating habits. History of falls. Memory and ability to understand (cognition). Work and work Astronomer. Reproductive health. Screening  You may have the following tests or measurements: Height, weight, and BMI. Blood pressure. Lipid and cholesterol levels. These may be checked every 5 years, or more frequently if you are over 35 years old. Skin check. Lung cancer screening. You may have this screening every year starting at age 11 if you have a 30-pack-year history of smoking and currently smoke or have quit within the past 15 years. Fecal occult blood test (FOBT) of the stool. You may have this test every year starting at age 90. Flexible sigmoidoscopy or colonoscopy. You may have a sigmoidoscopy every 5 years or a colonoscopy every 10 years starting at age 66. Hepatitis C blood test. Hepatitis B blood test. Sexually transmitted disease (STD) testing. Diabetes screening. This is done by checking your blood sugar (glucose) after you have not eaten for a while (fasting). You may have this done every 1-3 years. Bone density scan. This is done to screen for osteoporosis. You may have this done starting at age 64. Mammogram. This may be done every 1-2 years. Talk to your health care provider about how often you should have regular mammograms. Talk with your health care provider about your test results, treatment  options, and if necessary, the need for more tests. Vaccines  Your health care provider may recommend certain vaccines, such as: Influenza vaccine. This is recommended every year. Tetanus, diphtheria, and acellular pertussis (Tdap, Td) vaccine. You may need a Td booster every 10 years. Zoster vaccine. You may need this after age 52. Pneumococcal 13-valent  conjugate (PCV13) vaccine. One dose is recommended after age 58. Pneumococcal polysaccharide (PPSV23) vaccine. One dose is recommended after age 31. Talk to your health care provider about which screenings and vaccines you need and how often you need them. This information is not intended to replace advice given to you by your health care provider. Make sure you discuss any questions you have with your health care provider. Document Released: 08/08/2015 Document Revised: 03/31/2016 Document Reviewed: 05/13/2015 Elsevier Interactive Patient Education  2017 [place] Prevention in the Home Falls can cause injuries. They can happen to people of all ages. There are many things you can do to make your home safe and to help prevent falls. What can I do on the outside of my home? Regularly fix the edges of walkways and driveways and fix any cracks. Remove anything that might make you trip as you walk through a door, such as a raised step or threshold. Trim any bushes or trees on the path to your home. Use bright outdoor lighting. Clear any walking paths of anything that might make someone trip, such as rocks or tools. Regularly check to see if handrails are loose or broken. Make sure that both sides of any steps have handrails. Any raised decks and porches should have guardrails on the edges. Have any leaves, snow, or ice cleared regularly. Use sand or salt on walking paths during winter. Clean up any spills in your garage right away. This includes oil or grease spills. What can I do in the bathroom? Use night lights. Install grab bars by the toilet and in the tub and shower. Do not use towel bars as grab bars. Use non-skid mats or decals in the tub or shower. If you need to sit down in the shower, use a plastic, non-slip stool. Keep the floor dry. Clean up any water that spills on the floor as soon as it happens. Remove soap buildup in the tub or shower regularly. Attach bath mats  securely with double-sided non-slip rug tape. Do not have throw rugs and other things on the floor that can make you trip. What can I do in the bedroom? Use night lights. Make sure that you have a light by your bed that is easy to reach. Do not use any sheets or blankets that are too big for your bed. They should not hang down onto the floor. Have a firm chair that has side arms. You can use this for support while you get dressed. Do not have throw rugs and other things on the floor that can make you trip. What can I do in the kitchen? Clean up any spills right away. Avoid walking on wet floors. Keep items that you use a lot in easy-to-reach places. If you need to reach something above you, use a strong step stool that has a grab bar. Keep electrical cords out of the way. Do not use floor polish or wax that makes floors slippery. If you must use wax, use non-skid floor wax. Do not have throw rugs and other things on the floor that can make you trip. What can I do with my stairs? Do not  leave any items on the stairs. Make sure that there are handrails on both sides of the stairs and use them. Fix handrails that are broken or loose. Make sure that handrails are as long as the stairways. Check any carpeting to make sure that it is firmly attached to the stairs. Fix any carpet that is loose or worn. Avoid having throw rugs at the top or bottom of the stairs. If you do have throw rugs, attach them to the floor with carpet tape. Make sure that you have a light switch at the top of the stairs and the bottom of the stairs. If you do not have them, ask someone to add them for you. What else can I do to help prevent falls? Wear shoes that: Do not have high heels. Have rubber bottoms. Are comfortable and fit you well. Are closed at the toe. Do not wear sandals. If you use a stepladder: Make sure that it is fully opened. Do not climb a closed stepladder. Make sure that both sides of the stepladder  are locked into place. Ask someone to hold it for you, if possible. Clearly mark and make sure that you can see: Any grab bars or handrails. First and last steps. Where the edge of each step is. Use tools that help you move around (mobility aids) if they are needed. These include: Canes. Walkers. Scooters. Crutches. Turn on the lights when you go into a dark area. Replace any light bulbs as soon as they burn out. Set up your furniture so you have a clear path. Avoid moving your furniture around. If any of your floors are uneven, fix them. If there are any pets around you, be aware of where they are. Review your medicines with your doctor. Some medicines can make you feel dizzy. This can increase your chance of falling. Ask your doctor what other things that you can do to help prevent falls. This information is not intended to replace advice given to you by your health care provider. Make sure you discuss any questions you have with your health care provider. Document Released: 05/08/2009 Document Revised: 12/18/2015 Document Reviewed: 08/16/2014 Elsevier Interactive Patient Education  2017 [employer].

## 2023-02-27 ENCOUNTER — Other Ambulatory Visit: Payer: Self-pay | Admitting: Family Medicine

## 2023-02-27 DIAGNOSIS — E782 Mixed hyperlipidemia: Secondary | ICD-10-CM

## 2023-02-28 NOTE — Telephone Encounter (Signed)
Requested Prescriptions  Pending Prescriptions Disp Refills   rosuvastatin (CRESTOR) 10 MG tablet [Pharmacy Med Name: ROSUVASTATIN CALCIUM 10 MG TAB] 90 tablet 0    Sig: TAKE 1 TABLET BY MOUTH THREE TIMES A WEEK     Cardiovascular:  Antilipid - Statins 2 Failed - 02/27/2023  9:29 AM      Failed - Lipid Panel in normal range within the last 12 months    Cholesterol, Total  Date Value Ref Range Status  12/02/2022 190 100 - 199 mg/dL Final   LDL Chol Calc (NIH)  Date Value Ref Range Status  12/02/2022 108 (H) 0 - 99 mg/dL Final   HDL  Date Value Ref Range Status  12/02/2022 71 >39 mg/dL Final   Triglycerides  Date Value Ref Range Status  12/02/2022 59 0 - 149 mg/dL Final         Passed - Cr in normal range and within 360 days    Creatinine, Ser  Date Value Ref Range Status  12/02/2022 0.74 0.57 - 1.00 mg/dL Final         Passed - Patient is not pregnant      Passed - Valid encounter within last 12 months    Recent Outpatient Visits           2 months ago Benign essential HTN   [place] [facility] [place], Marzella Schlein, MD   9 months ago Benign essential HTN   [place] [facility] [place], Marzella Schlein, MD   1 year ago Benign essential HTN   [place] [facility] Merita Norton T, FNP   1 year ago Benign essential HTN   [place] [facility] Merita Norton T, FNP   1 year ago Encounter for annual wellness visit (AWV) in Medicare patient   [place] [facility] [place], Marzella Schlein, MD       Future Appointments             In 3 months [place], Marzella Schlein, MD [facility]

## 2023-03-14 ENCOUNTER — Encounter: Payer: Self-pay | Admitting: Family Medicine

## 2023-03-14 ENCOUNTER — Ambulatory Visit (INDEPENDENT_AMBULATORY_CARE_PROVIDER_SITE_OTHER): Payer: PPO | Admitting: Family Medicine

## 2023-03-14 VITALS — BP 147/64 | HR 74 | Temp 98.2°F | Resp 12 | Ht 64.0 in | Wt 148.1 lb

## 2023-03-14 DIAGNOSIS — I10 Essential (primary) hypertension: Secondary | ICD-10-CM | POA: Diagnosis not present

## 2023-03-14 DIAGNOSIS — Z23 Encounter for immunization: Secondary | ICD-10-CM | POA: Diagnosis not present

## 2023-03-14 MED ORDER — METOPROLOL TARTRATE 25 MG PO TABS: 12.5000 mg | ORAL_TABLET | Freq: Two times a day (BID) | ORAL | 1 refills | Status: DC | PRN

## 2023-03-14 NOTE — Progress Notes (Signed)
Established Patient Office Visit  Subjective   Patient ID: Mckenzie Willis, female    DOB: Aug 24, 1950  Age: 72 y.o. MRN: [id number]  Chief Complaint  Patient presents with   Medical Management of Chronic Issues    Patient reports fair compliance with medications. She is taking amlodipine only as needed. She reports when she takes losartan-hydrochlorothiazide and amlodipine it makes her BP goes down. Patient reports BP was in the low 50s. This morning BP was 148/74 at 7am, 11am 117/63.    Mckenzie Willis is here with concerns regarding fluctuating BP. She has a long standing history of her SBP fluctuating throughout the day. Over the past couple of months her BP  has been causing her troubles. When she wakes up in the morning, she takes her BP. If it is very low (<120/80), she holds her losartan - hydrochlorothiazide. She has been using her amlodipine 2.5 mg prn for elevated BP.  She was worried because she has had two "spells" where she feels unwell. She cannot identify exactly what is going on or what is feeling unwell. She did notice some palpitations. When she has these spells, she feels like she might pass out and has to sit on the floor in order to improve her condition.    Review of Systems  Eyes:  Negative for blurred vision and double vision.  Respiratory:  Negative for shortness of breath.   Cardiovascular:  Positive for palpitations. Negative for chest pain.  Neurological:  Positive for dizziness. Negative for loss of consciousness and headaches.      Objective:     BP (!) 147/64 (BP Location: Left Arm, Patient Position: Sitting, Cuff Size: Normal)   Pulse 74   Temp 98.2 F (36.8 C) (Temporal)   Resp 12   Ht 5\' 4"  (1.626 m)   Wt 148 lb 1.6 oz (67.2 kg)   SpO2 97%   BMI 25.42 kg/m  BP Readings from Last 3 Encounters:  03/14/23 (!) 147/64  12/02/22 132/63  05/28/22 137/79      Physical Exam Constitutional:      Appearance: Normal appearance.  Cardiovascular:      Rate and Rhythm: Normal rate and regular rhythm.     Heart sounds: Normal heart sounds.  Pulmonary:     Effort: Pulmonary effort is normal.     Breath sounds: Normal breath sounds.  Neurological:     General: No focal deficit present.     Mental Status: She is alert and oriented to person, place, and time.      No results found for any visits on 03/14/23.    The ASCVD Risk score (Arnett DK, et al., 2019) failed to calculate for the following reasons:   The systolic blood pressure is missing    Assessment & Plan:   Problem List Items Addressed This Visit       Cardiovascular and Mediastinum   Benign essential HTN - Primary    Relatively uncontrolled  Recent symptoms and wildly fluctuating BP is suspicious for POTS  Advised to prioritize 3 L of water a day along with balanced salt intake  Encouraged exercise  Advised use of compression stockings  Discontinued Amlodipine 2.5 mg  Continue losartan hydrochlorothiazide 100 - 12.5 mg daily (take every morning unless SBP < 105) 1/2 tablet Metoprolol 25 mg prn for elevated BP      Relevant Medications   metoprolol tartrate (LOPRESSOR) 25 MG tablet    Return in about 4 weeks (around 04/11/2023) for BP  f/u.    Rometta Emery, Medical Student  Patient seen along with MS3 student Jodi Marble. I personally evaluated this patient along with the student, and verified all aspects of the history, physical exam, and medical decision making as documented by the student. I agree with the student's documentation and have made all necessary edits.  Quatisha Zylka, Marzella Schlein, MD, MPH [facility]

## 2023-03-14 NOTE — Assessment & Plan Note (Signed)
Relatively uncontrolled  Recent symptoms and wildly fluctuating BP is suspicious for POTS  Advised to prioritize 3 L of water a day along with balanced salt intake  Encouraged exercise  Advised use of compression stockings  Discontinued Amlodipine 2.5 mg  Continue losartan hydrochlorothiazide 100 - 12.5 mg daily (take every morning unless SBP < 105) 1/2 tablet Metoprolol 25 mg prn for elevated BP

## 2023-04-06 ENCOUNTER — Other Ambulatory Visit: Payer: Self-pay | Admitting: Family Medicine

## 2023-04-11 ENCOUNTER — Encounter: Payer: Self-pay | Admitting: Family Medicine

## 2023-04-11 ENCOUNTER — Ambulatory Visit (INDEPENDENT_AMBULATORY_CARE_PROVIDER_SITE_OTHER): Payer: PPO | Admitting: Family Medicine

## 2023-04-11 VITALS — BP 134/67 | HR 77 | Ht 64.0 in | Wt 147.8 lb

## 2023-04-11 DIAGNOSIS — I10 Essential (primary) hypertension: Secondary | ICD-10-CM | POA: Diagnosis not present

## 2023-04-11 MED ORDER — LOSARTAN POTASSIUM-HCTZ 100-12.5 MG PO TABS: 0.5000 | ORAL_TABLET | Freq: Two times a day (BID) | ORAL | 1 refills | Status: DC

## 2023-04-11 NOTE — Assessment & Plan Note (Signed)
Fluctuating blood pressure readings with periods of hypotension causing symptoms of fatigue and periods of hypertension. Patient has been self-adjusting medication dosages. Currently on Losartan HCTZ with Metoprolol as needed. -Split Losartan HCTZ dose to half in the morning and half at night to avoid drastic drops in blood pressure after medication and high readings in the morning. -Continue to monitor blood pressure at home and maintain a log. -Contact office if issues arise before next scheduled appointment in November.

## 2023-04-11 NOTE — Progress Notes (Signed)
Established Patient Office Visit  Subjective   Patient ID: Mckenzie Willis, female    DOB: 1951-05-02  Age: 72 y.o. MRN: [id number]  Chief Complaint  Patient presents with   Medical Management of Chronic Issues    4 week follow up on BP, Patient stated she's doing good    HPI  Discussed the use of AI scribe software for clinical note transcription with the patient, who gave verbal consent to proceed.  History of Present Illness   The patient, with a history of hypertension, presents for a blood pressure recheck. She reports that her blood pressure has been fluctuating wildly and she experiences symptoms of feeling unwell when her blood pressure drops. She has been self-adjusting her medication regimen in response to these symptoms. She has stopped taking amlodipine and is currently on losartan-HCTZ. She also has metoprolol available as needed, but has not taken any. She reports episodes of fast heart rate and indigestion, which she is concerned may be signs of heart trouble. She has been keeping a log of her blood pressure readings and medication adjustments.         ROS per HPI    Objective:     BP 134/67 Comment: home reading  Pulse 77   Ht 5\' 4"  (1.626 m)   Wt 147 lb 12.8 oz (67 kg)   SpO2 99%   BMI 25.37 kg/m    Physical Exam Vitals reviewed.  Constitutional:      General: She is not in acute distress.    Appearance: Normal appearance. She is well-developed. She is not diaphoretic.  HENT:     Head: Normocephalic and atraumatic.  Eyes:     General: No scleral icterus.    Conjunctiva/sclera: Conjunctivae normal.  Neck:     Thyroid: No thyromegaly.  Cardiovascular:     Rate and Rhythm: Normal rate and regular rhythm.     Pulses: Normal pulses.     Heart sounds: Normal heart sounds. No murmur heard. Pulmonary:     Effort: No respiratory distress.     Breath sounds: Normal breath sounds. No wheezing, rhonchi or rales.  Musculoskeletal:     Cervical back:  Neck supple.     Right lower leg: No edema.     Left lower leg: No edema.  Lymphadenopathy:     Cervical: No cervical adenopathy.  Skin:    General: Skin is warm and dry.     Findings: No rash.  Neurological:     Mental Status: She is alert and oriented to person, place, and time. Mental status is at baseline.  Psychiatric:        Mood and Affect: Mood normal.        Behavior: Behavior normal.      No results found for any visits on 04/11/23.    The 10-year ASCVD risk score (Arnett DK, et al., 2019) is: 12.9%    Assessment & Plan:   Problem List Items Addressed This Visit       Cardiovascular and Mediastinum   Benign essential HTN - Primary    Fluctuating blood pressure readings with periods of hypotension causing symptoms of fatigue and periods of hypertension. Patient has been self-adjusting medication dosages. Currently on Losartan HCTZ with Metoprolol as needed. -Split Losartan HCTZ dose to half in the morning and half at night to avoid drastic drops in blood pressure after medication and high readings in the morning. -Continue to monitor blood pressure at home and maintain a log. -Contact  office if issues arise before next scheduled appointment in November.       Relevant Medications   losartan-hydrochlorothiazide (HYZAAR) 100-12.5 MG tablet        Return in about 2 months (around 06/11/2023) for as scheduled.    Shirlee Latch, MD

## 2023-05-29 IMAGING — MG MM DIGITAL SCREENING BILAT W/ TOMO AND CAD
8 series · 8 of 24 positions shown · non-contrast
Comparison: Previous exam(s).

CLINICAL DATA: Screening.

EXAM:
DIGITAL SCREENING BILATERAL MAMMOGRAM WITH TOMOSYNTHESIS AND CAD
TECHNIQUE: Bilateral screening digital craniocaudal and mediolateral oblique
mammograms were obtained. Bilateral screening digital breast
tomosynthesis was performed. The images were evaluated with
computer-aided detection.

[R CC synth-2D]
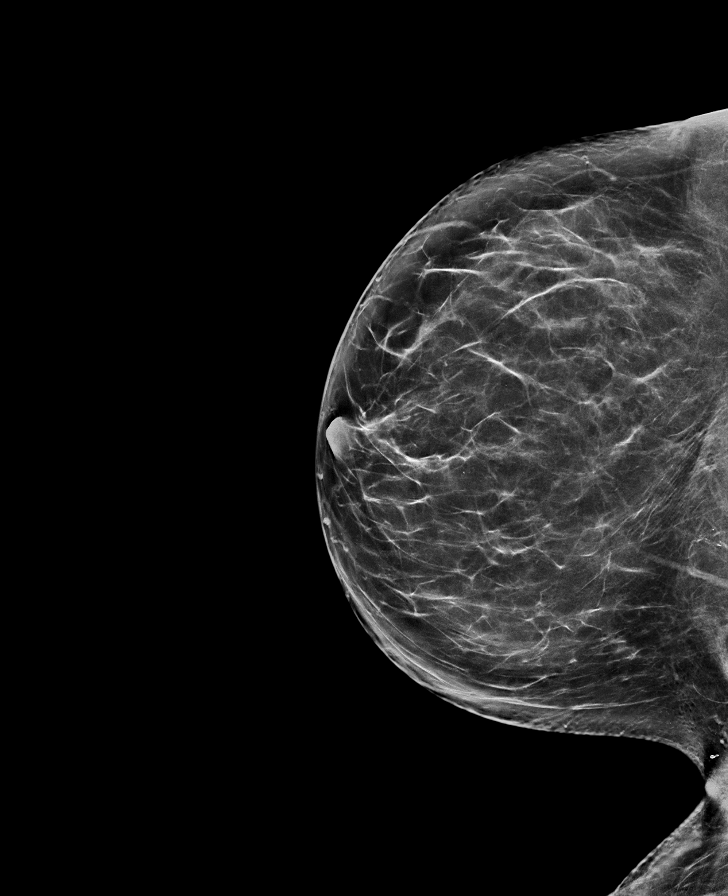

[L MLO synth-2D]
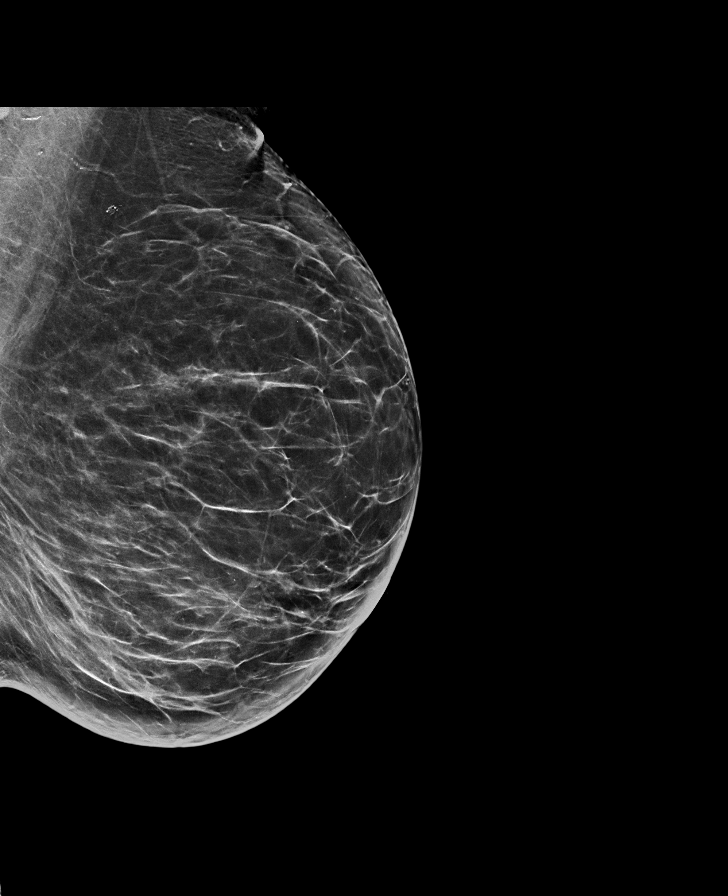

[L CC synth-2D]
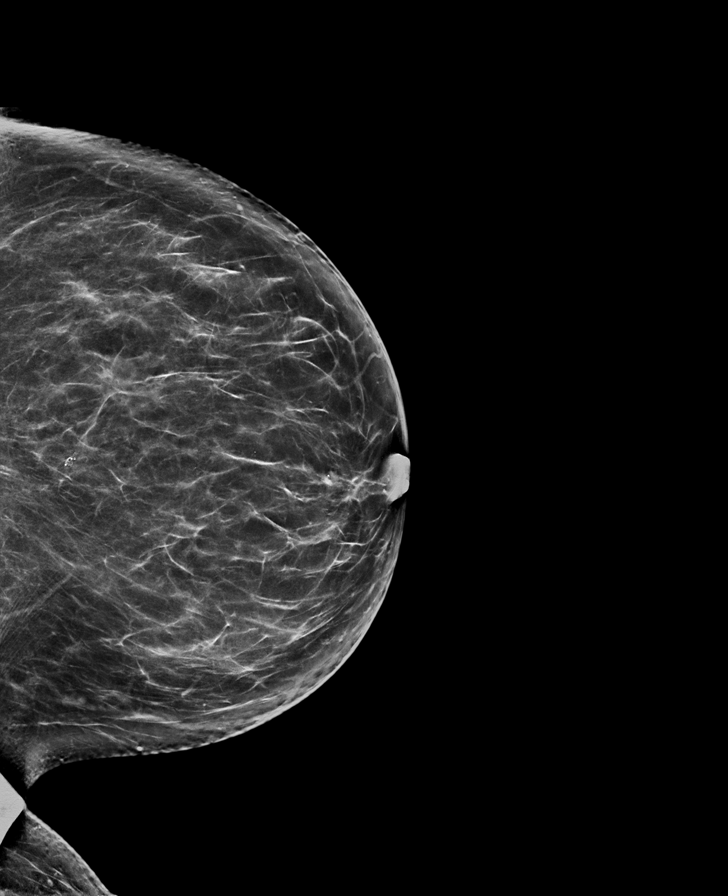

[R MLO synth-2D]
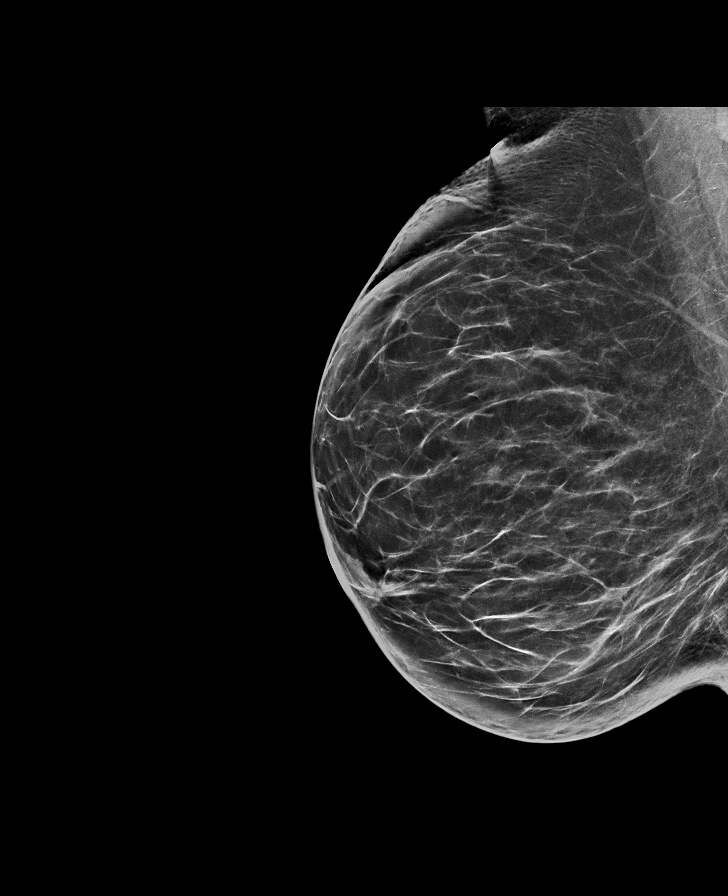

[L MLO tomo · tomo slice 43/84.0]
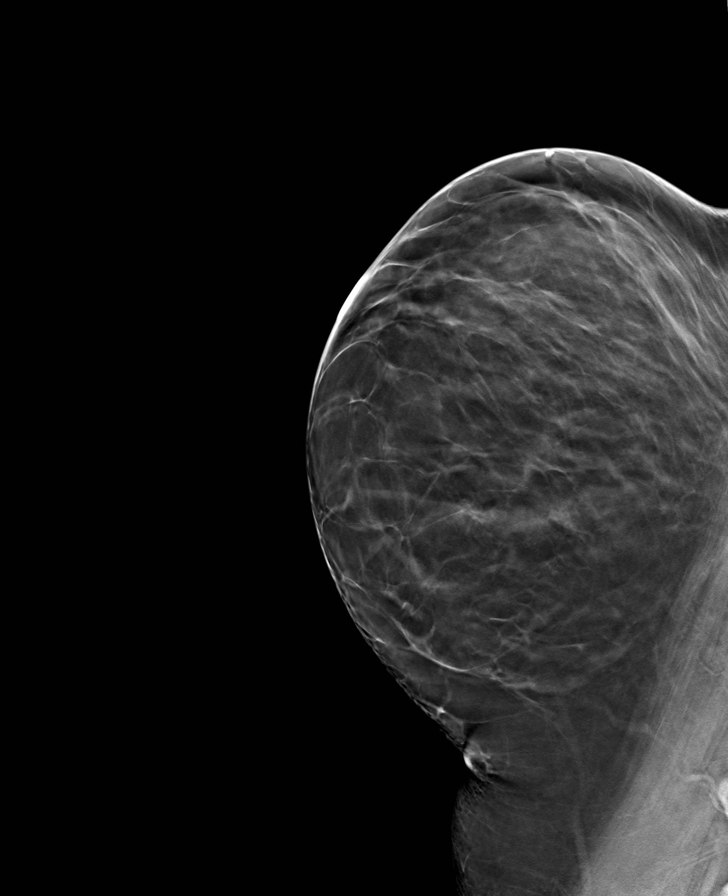

[R CC tomo · tomo slice 41/81.0]
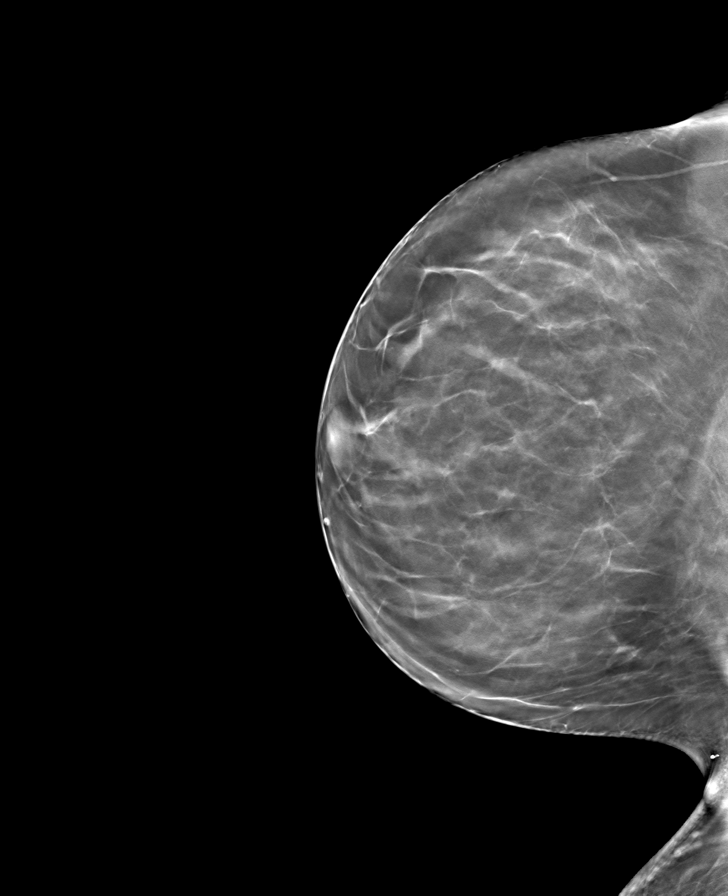

[L CC tomo · tomo slice 40/79.0]
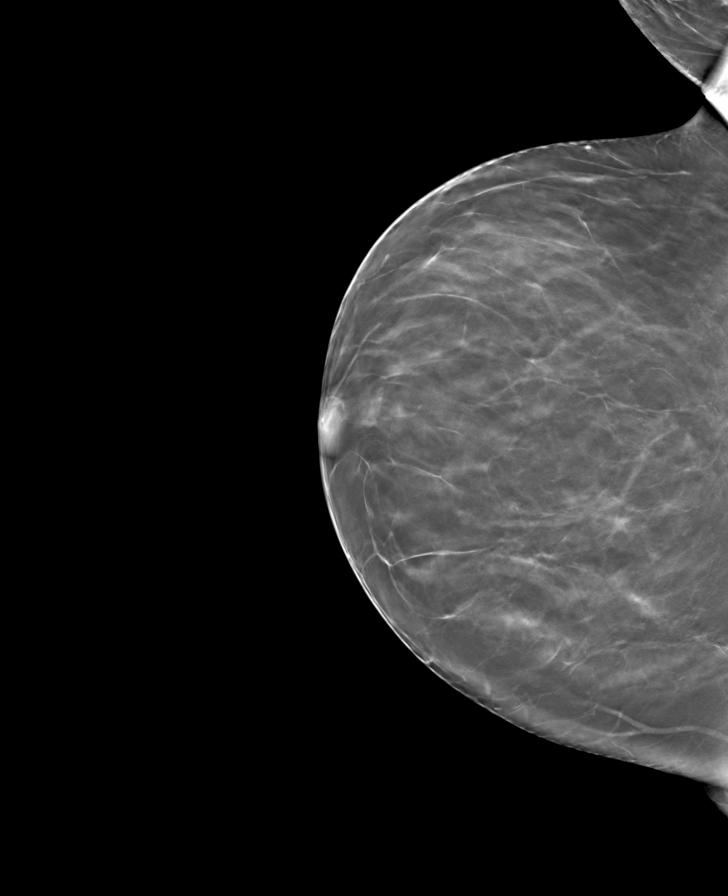

[R MLO tomo · tomo slice 42/83.0]
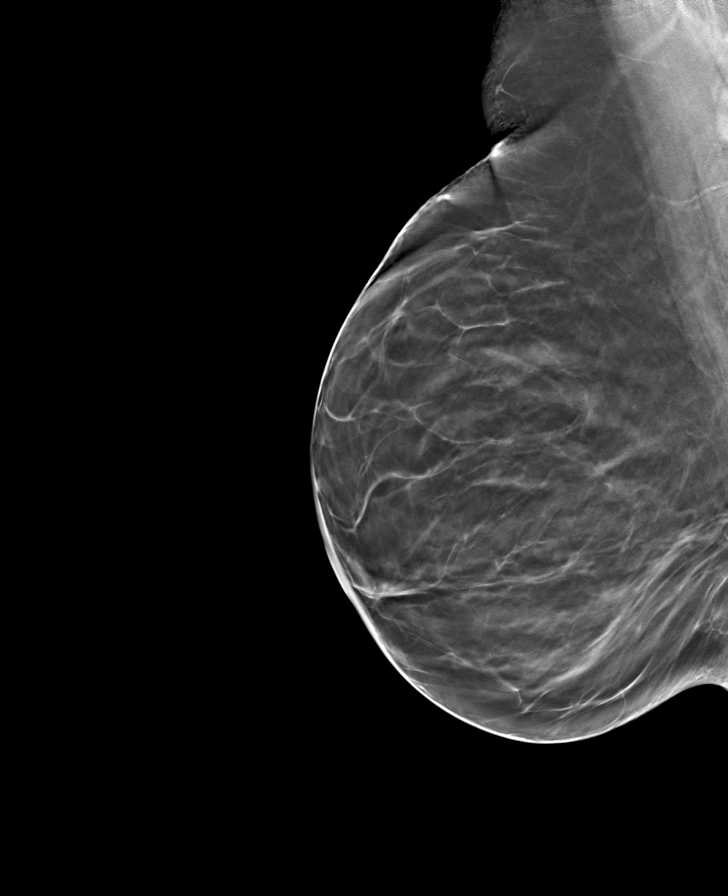

[8 of 24 positions shown; findings below may reference images not displayed]

ACR Breast Density Category b: There are scattered areas of
fibroglandular density.
FINDINGS: There are no findings suspicious for malignancy. The images were
evaluated with computer-aided detection.
IMPRESSION: No mammographic evidence of malignancy. A result letter of this
screening mammogram will be mailed directly to the patient.

RECOMMENDATION:
Screening mammogram in one year. (Code:[id number]-I-BG6)

BI-RADS CATEGORY  1: Negative.

## 2023-06-03 NOTE — Patient Instructions (Signed)
Recommend going to pharmacy for Shingrix, Tdap and Covid vaccines

## 2023-06-07 ENCOUNTER — Ambulatory Visit (INDEPENDENT_AMBULATORY_CARE_PROVIDER_SITE_OTHER): Payer: PPO | Admitting: Family Medicine

## 2023-06-07 ENCOUNTER — Encounter: Payer: Self-pay | Admitting: Family Medicine

## 2023-06-07 VITALS — BP 116/61 | HR 65 | Ht 64.5 in | Wt 148.9 lb

## 2023-06-07 DIAGNOSIS — R7303 Prediabetes: Secondary | ICD-10-CM | POA: Diagnosis not present

## 2023-06-07 DIAGNOSIS — E782 Mixed hyperlipidemia: Secondary | ICD-10-CM

## 2023-06-07 DIAGNOSIS — J302 Other seasonal allergic rhinitis: Secondary | ICD-10-CM | POA: Insufficient documentation

## 2023-06-07 DIAGNOSIS — E041 Nontoxic single thyroid nodule: Secondary | ICD-10-CM | POA: Diagnosis not present

## 2023-06-07 DIAGNOSIS — Z0000 Encounter for general adult medical examination without abnormal findings: Secondary | ICD-10-CM

## 2023-06-07 DIAGNOSIS — I10 Essential (primary) hypertension: Secondary | ICD-10-CM | POA: Diagnosis not present

## 2023-06-07 NOTE — Assessment & Plan Note (Signed)
Previously well controlled Continue statin Repeat FLP and CMP  

## 2023-06-07 NOTE — Assessment & Plan Note (Signed)
Due for HbA1c recheck to monitor blood sugar control. Expressed concern about recent dietary indiscretions but reassured that HbA1c reflects a three-month average. Discussed importance of lifestyle changes before medication adjustments. - Order HbA1c test - Encourage adherence to low-carb diet

## 2023-06-07 NOTE — Assessment & Plan Note (Signed)
Thyroid nodules b/l stable since 2013  ?Previous radioactive iodine uptake showed normal function ?Continue to monitor TSH ?

## 2023-06-07 NOTE — Assessment & Plan Note (Signed)
Chronic Sinusitis/Allergic Rhinitis Chronic sinus issues worsening in the fall. Alternating between Zyrtec and Claritin with limited relief. No sinus headaches. Has not tried Allegra or Xyzal and does not use steroid nasal sprays. Discussed potential benefits of Xyzal and Flonase for better symptom control and reduced nasal congestion. - Recommend Xyzal (levocetirizine) instead of Zyrtec or Claritin - Recommend Flonase nasal spray, two sprays in each nostril at bedtime

## 2023-06-07 NOTE — Progress Notes (Signed)
Complete physical exam  Patient: Mckenzie Willis   DOB: 1951/01/31   72 y.o. Female  MRN: [id number]  Subjective:    Chief Complaint  Patient presents with   Annual Exam    AWV completed 01/25/23. Patient reports trying to consume a low carb diet and trying to walk more as exercise. She reports feeling well and sleeping well with no other concerns.    Immunizations    Patient declined pneumonia vaccine.    Briley Willis is a 72 y.o. female who presents today for a complete physical exam.   Discussed the use of AI scribe software for clinical note transcription with the patient, who gave verbal consent to proceed.  History of Present Illness   The patient, with a history of hypertension and blood sugar issues, presents with concerns about high blood pressure readings. The patient reports a blood pressure reading of 142/72 at home and 185 in the clinic. The patient is currently on losartan hydrochlorothiazide, taking half of the 125mg  pill twice a day. The patient reports feeling better with this regimen and has not experienced any episodes of low blood pressure. The patient also mentions a desire to improve lifestyle habits, including diet and exercise, to better manage blood pressure.  In addition to hypertension, the patient reports sinus issues, which have been unresponsive to Zyrtec and Claritin. The patient describes frequent sneezing and nasal congestion, which are particularly bothersome in the fall. The patient denies any sickness associated with these symptoms.  The patient also mentions a recent back problem, which has limited their ability to walk. However, the patient reports that this issue is improving. The patient also has a history of heart rate issues, for which metoprolol was prescribed as needed, but reports no recent episodes requiring the use of this medication.        Most recent fall risk assessment:    03/14/2023    1:16 PM  Fall Risk   Falls in the  past year? 1  Number falls in past yr: 0  Injury with Fall? 0  Risk for fall due to : History of fall(s)  Follow up Falls evaluation completed     Most recent depression screenings:    04/11/2023   10:24 AM 03/14/2023    1:17 PM  PHQ 2/9 Scores  PHQ - 2 Score 0 0  PHQ- 9 Score 0 0        Patient Care Team: Erasmo Downer, MD as PCP - General (Family Medicine)   Outpatient Medications Prior to Visit  Medication Sig   Cholecalciferol (VITAMIN D-3) 25 MCG (1000 UT) CAPS Take 1 capsule by mouth daily.   losartan-hydrochlorothiazide (HYZAAR) 100-12.5 MG tablet Take 0.5 tablets by mouth 2 (two) times daily.   metoprolol tartrate (LOPRESSOR) 25 MG tablet TAKE 0.5 TABLETS (12.5 MG TOTAL) BY MOUTH 2 (TWO) TIMES DAILY AS NEEDED (TACHYCARDIA OR HYPERTENSION).   rosuvastatin (CRESTOR) 10 MG tablet TAKE 1 TABLET BY MOUTH THREE TIMES A WEEK   [DISCONTINUED] diclofenac Sodium (VOLTAREN) 1 % GEL Apply topically 4 (four) times daily. (Patient not taking: Reported on 06/07/2023)   No facility-administered medications prior to visit.    ROS        Objective:     BP 116/61 Comment: home reading  Pulse 65   Ht 5' 4.5" (1.638 m)   Wt 148 lb 14.4 oz (67.5 kg)   BMI 25.16 kg/m    Physical Exam Vitals reviewed.  Constitutional:  General: She is not in acute distress.    Appearance: Normal appearance. She is well-developed. She is not diaphoretic.  HENT:     Head: Normocephalic and atraumatic.     Right Ear: Tympanic membrane, ear canal and external ear normal.     Left Ear: Tympanic membrane, ear canal and external ear normal.     Nose: Nose normal.     Mouth/Throat:     Mouth: Mucous membranes are moist.     Pharynx: Oropharynx is clear. No oropharyngeal exudate.  Eyes:     General: No scleral icterus.    Conjunctiva/sclera: Conjunctivae normal.     Pupils: Pupils are equal, round, and reactive to light.  Neck:     Thyroid: No thyromegaly.  Cardiovascular:      Rate and Rhythm: Normal rate and regular rhythm.     Heart sounds: Normal heart sounds. No murmur heard. Pulmonary:     Effort: Pulmonary effort is normal. No respiratory distress.     Breath sounds: Normal breath sounds. No wheezing or rales.  Abdominal:     General: There is no distension.     Palpations: Abdomen is soft.     Tenderness: There is no abdominal tenderness.  Musculoskeletal:        General: No deformity.     Cervical back: Neck supple.     Right lower leg: No edema.     Left lower leg: No edema.  Lymphadenopathy:     Cervical: No cervical adenopathy.  Skin:    General: Skin is warm and dry.     Findings: No rash.  Neurological:     Mental Status: She is alert and oriented to person, place, and time. Mental status is at baseline.     Gait: Gait normal.  Psychiatric:        Mood and Affect: Mood normal.        Behavior: Behavior normal.        Thought Content: Thought content normal.      No results found for any visits on 06/07/23.     Assessment & Plan:    Routine Health Maintenance and Physical Exam  Immunization History  Administered Date(s) Administered   Fluad Quad(high Dose 65+) 05/28/2022, 03/14/2023   Influenza-Unspecified 06/26/2015    Health Maintenance  Topic Date Due   DTaP/Tdap/Td (1 - Tdap) Never done   Zoster Vaccines- Shingrix (1 of 2) Never done   Pneumonia Vaccine 6+ Years old (1 of 1 - PCV) Never done   COVID-19 Vaccine (1 - 2023-24 season) Never done   Medicare Annual Wellness (AWV)  01/25/2024   Colonoscopy  01/10/2025   MAMMOGRAM  01/24/2025   INFLUENZA VACCINE  Completed   DEXA SCAN  Completed   Hepatitis C Screening  Completed   HPV VACCINES  Aged Out    Discussed health benefits of physical activity, and encouraged her to engage in regular exercise appropriate for her age and condition.  Problem List Items Addressed This Visit       Cardiovascular and Mediastinum   Benign essential HTN    Chronic hypertension  with variable readings. Home readings are generally well-controlled, but office readings are elevated (185/72 and 180/xx). Currently on losartan hydrochlorothiazide 100-25 mg, taking half a pill twice a day. No recent hypotension. Advised on lifestyle modifications before medication adjustments. Discussed risks of increasing medication potentially causing hypotension, especially given good afternoon readings. - Follow-up in three months for blood pressure - Encourage strict adherence to  low-carb diet and regular exercise      Relevant Orders   Comprehensive metabolic panel     Respiratory   Seasonal allergic rhinitis    Chronic Sinusitis/Allergic Rhinitis Chronic sinus issues worsening in the fall. Alternating between Zyrtec and Claritin with limited relief. No sinus headaches. Has not tried Allegra or Xyzal and does not use steroid nasal sprays. Discussed potential benefits of Xyzal and Flonase for better symptom control and reduced nasal congestion. - Recommend Xyzal (levocetirizine) instead of Zyrtec or Claritin - Recommend Flonase nasal spray, two sprays in each nostril at bedtime        Endocrine   Thyroid nodule    Thyroid nodules b/l stable since 2013  Previous radioactive iodine uptake showed normal function Continue to monitor TSH      Relevant Orders   TSH     Other   HLD (hyperlipidemia)    Previously well controlled Continue statin Repeat FLP and CMP      Relevant Orders   Comprehensive metabolic panel   Lipid Panel With LDL/HDL Ratio   Prediabetes    Due for HbA1c recheck to monitor blood sugar control. Expressed concern about recent dietary indiscretions but reassured that HbA1c reflects a three-month average. Discussed importance of lifestyle changes before medication adjustments. - Order HbA1c test - Encourage adherence to low-carb diet      Relevant Orders   Hemoglobin A1c   Other Visit Diagnoses     Encounter for annual physical exam    -  Primary    Relevant Orders   Hemoglobin A1c   Comprehensive metabolic panel   Lipid Panel With LDL/HDL Ratio   TSH           General Health Maintenance Routine health maintenance discussed, including vaccinations and screenings. Up to date on flu shot and mammogram. Colonoscopy not due for another two years. - Offer pneumonia, shingles, tetanus shots, and COVID booster - patient declines - Encourage signing up for MyChart for easier access to lab results and communication  Follow-up - Schedule a three-month follow-up for blood pressure check - Order labs for kidney and liver function, and cholesterol - Advise to visit the lab at their convenience for blood work.        Return in about 3 months (around 09/07/2023) for BP f/u.     Shirlee Latch, MD

## 2023-06-07 NOTE — Assessment & Plan Note (Signed)
Chronic hypertension with variable readings. Home readings are generally well-controlled, but office readings are elevated (185/72 and 180/xx). Currently on losartan hydrochlorothiazide 100-25 mg, taking half a pill twice a day. No recent hypotension. Advised on lifestyle modifications before medication adjustments. Discussed risks of increasing medication potentially causing hypotension, especially given good afternoon readings. - Follow-up in three months for blood pressure - Encourage strict adherence to low-carb diet and regular exercise

## 2023-08-14 ENCOUNTER — Other Ambulatory Visit: Payer: Self-pay | Admitting: Family Medicine

## 2023-08-14 DIAGNOSIS — E782 Mixed hyperlipidemia: Secondary | ICD-10-CM

## 2023-09-08 ENCOUNTER — Encounter: Payer: Self-pay | Admitting: Family Medicine

## 2023-09-08 ENCOUNTER — Ambulatory Visit: Payer: PPO | Admitting: Family Medicine

## 2023-09-08 VITALS — BP 117/62 | HR 71 | Ht 64.5 in | Wt 137.9 lb

## 2023-09-08 DIAGNOSIS — E041 Nontoxic single thyroid nodule: Secondary | ICD-10-CM

## 2023-09-08 DIAGNOSIS — R7303 Prediabetes: Secondary | ICD-10-CM | POA: Diagnosis not present

## 2023-09-08 DIAGNOSIS — E782 Mixed hyperlipidemia: Secondary | ICD-10-CM | POA: Diagnosis not present

## 2023-09-08 DIAGNOSIS — I10 Essential (primary) hypertension: Secondary | ICD-10-CM | POA: Diagnosis not present

## 2023-09-08 NOTE — Assessment & Plan Note (Signed)
Previously well controlled Continue statin Repeat FLP and CMP

## 2023-09-08 NOTE — Progress Notes (Signed)
Established patient visit   Patient: Mckenzie Willis   DOB: 1950-12-29   73 y.o. Female  MRN: [id number] Visit Date: 09/08/2023  Today's healthcare provider: Shirlee Latch, MD   Chief Complaint  Patient presents with   Medical Management of Chronic Issues    3 month follow-up   Hypertension    Patient reports monitoring at home with no symptoms to report and states she has not had to use her metoprolol.   Immunizations    Pneumonia Vaccine declined   Subjective    Hypertension   HPI     Medical Management of Chronic Issues    Additional comments: 3 month follow-up        Hypertension    Additional comments: Patient reports monitoring at home with no symptoms to report and states she has not had to use her metoprolol.        Immunizations    Additional comments: Pneumonia Vaccine declined      Last edited by Acey Lav, CMA on 09/08/2023  9:45 AM.       Discussed the use of AI scribe software for clinical note transcription with the patient, who gave verbal consent to proceed.  History of Present Illness   The patient, with a history of hypertension, presents with concerns about fluctuating blood pressure readings at home. She reports feeling drained when the blood pressure is low and experiences anxiety-induced high blood pressure readings, particularly when anticipating medical appointments. The patient has been diligent in monitoring her blood pressure at home and adjusting medication timing as needed to manage these fluctuations. She has also noticed a correlation between hydration status and blood pressure readings, with lower readings observed when she is dehydrated. The patient also inquires about the potential benefits of beetroot extract for blood pressure management.         Medications: Outpatient Medications Prior to Visit  Medication Sig   Cholecalciferol (VITAMIN D-3) 25 MCG (1000 UT) CAPS Take 1 capsule by mouth daily.    losartan-hydrochlorothiazide (HYZAAR) 100-12.5 MG tablet Take 0.5 tablets by mouth 2 (two) times daily.   rosuvastatin (CRESTOR) 10 MG tablet TAKE 1 TABLET BY MOUTH THREE TIMES A WEEK.   [DISCONTINUED] metoprolol tartrate (LOPRESSOR) 25 MG tablet TAKE 0.5 TABLETS (12.5 MG TOTAL) BY MOUTH 2 (TWO) TIMES DAILY AS NEEDED (TACHYCARDIA OR HYPERTENSION). (Patient not taking: Reported on 09/08/2023)   No facility-administered medications prior to visit.    Review of Systems     Objective    BP 117/62 Comment: home reading  Pulse 71   Ht 5' 4.5" (1.638 m)   Wt 137 lb 14.4 oz (62.6 kg)   SpO2 100%   BMI 23.30 kg/m    Physical Exam Vitals reviewed.  Constitutional:      General: She is not in acute distress.    Appearance: Normal appearance. She is well-developed. She is not diaphoretic.  HENT:     Head: Normocephalic and atraumatic.  Eyes:     General: No scleral icterus.    Conjunctiva/sclera: Conjunctivae normal.  Neck:     Thyroid: No thyromegaly.  Cardiovascular:     Rate and Rhythm: Normal rate and regular rhythm.     Heart sounds: Normal heart sounds. No murmur heard. Pulmonary:     Effort: Pulmonary effort is normal. No respiratory distress.     Breath sounds: Normal breath sounds. No wheezing, rhonchi or rales.  Musculoskeletal:     Cervical back: Neck supple.  Right lower leg: No edema.     Left lower leg: No edema.  Lymphadenopathy:     Cervical: No cervical adenopathy.  Skin:    General: Skin is warm and dry.     Findings: No rash.  Neurological:     Mental Status: She is alert and oriented to person, place, and time. Mental status is at baseline.  Psychiatric:        Mood and Affect: Mood normal.        Behavior: Behavior normal.      No results found for any visits on 09/08/23.  Assessment & Plan     Problem List Items Addressed This Visit       Cardiovascular and Mediastinum   Benign essential HTN - Primary   Hypertension with fluctuating  readings, influenced by white coat hypertension. Reports occasional high (e.g., 151 systolic) and low (e.g., 91/53) readings. Currently on losartan 100 mg and hydrochlorothiazide 12.5 mg, half a pill twice daily. Experiences dizziness and fatigue with low readings. Discussed hydration and holding medication if blood pressure is too low. Consider beetroot extract as an adjunct. - Continue losartan-hydrochlorothiazide 100-12.5 mg, half a pill twice daily - Monitor blood pressure at home and hold medication if too low - Increase water intake, especially with low blood pressure symptoms - Consider beetroot extract - Order blood work: A1c, cholesterol, kidney and liver function, thyroid - Schedule six-month follow-up or sooner if needed      Relevant Orders   Comprehensive metabolic panel     Endocrine   Thyroid nodule   Thyroid nodules b/l stable since 2013  Previous radioactive iodine uptake showed normal function Continue to monitor TSH      Relevant Orders   TSH     Other   HLD (hyperlipidemia)   Previously well controlled Continue statin Repeat FLP and CMP      Relevant Orders   Lipid panel   Comprehensive metabolic panel   Prediabetes   Prediabetes, monitoring glucose control with A1c test today. - Order A1c test      Relevant Orders   Hemoglobin A1c     Return in about 9 months (around 06/07/2024) for CPE, as scheduled.       Shirlee Latch, MD  [facility] [phone] (phone) [phone] (fax)  [facility]

## 2023-09-08 NOTE — Assessment & Plan Note (Signed)
Prediabetes, monitoring glucose control with A1c test today. - Order A1c test

## 2023-09-08 NOTE — Assessment & Plan Note (Signed)
Hypertension with fluctuating readings, influenced by white coat hypertension. Reports occasional high (e.g., 151 systolic) and low (e.g., 91/53) readings. Currently on losartan 100 mg and hydrochlorothiazide 12.5 mg, half a pill twice daily. Experiences dizziness and fatigue with low readings. Discussed hydration and holding medication if blood pressure is too low. Consider beetroot extract as an adjunct. - Continue losartan-hydrochlorothiazide 100-12.5 mg, half a pill twice daily - Monitor blood pressure at home and hold medication if too low - Increase water intake, especially with low blood pressure symptoms - Consider beetroot extract - Order blood work: A1c, cholesterol, kidney and liver function, thyroid - Schedule six-month follow-up or sooner if needed

## 2023-09-08 NOTE — Assessment & Plan Note (Signed)
Thyroid nodules b/l stable since 2013  ?Previous radioactive iodine uptake showed normal function ?Continue to monitor TSH ?

## 2023-09-09 LAB — TSH: TSH: 4.07 u[IU]/mL (ref 0.450–4.500)

## 2023-09-09 LAB — COMPREHENSIVE METABOLIC PANEL
ALT: 9 [IU]/L (ref 0–32)
AST: 17 [IU]/L (ref 0–40)
Albumin: 4.7 g/dL (ref 3.8–4.8)
Alkaline Phosphatase: 73 [IU]/L (ref 44–121)
BUN/Creatinine Ratio: 26 (ref 12–28)
BUN: 21 mg/dL (ref 8–27)
Bilirubin Total: 0.6 mg/dL (ref 0.0–1.2)
CO2: 24 mmol/L (ref 20–29)
Calcium: 10.2 mg/dL (ref 8.7–10.3)
Chloride: 99 mmol/L (ref 96–106)
Creatinine, Ser: 0.8 mg/dL (ref 0.57–1.00)
Globulin, Total: 2.8 g/dL (ref 1.5–4.5)
Glucose: 91 mg/dL (ref 70–99)
Potassium: 4.2 mmol/L (ref 3.5–5.2)
Sodium: 139 mmol/L (ref 134–144)
Total Protein: 7.5 g/dL (ref 6.0–8.5)
eGFR: 79 mL/min/{1.73_m2} (ref >59)

## 2023-09-09 LAB — LIPID PANEL
Chol/HDL Ratio: 2.9 {ratio} (ref 0.0–4.4)
Cholesterol, Total: 203 mg/dL — ABNORMAL HIGH (ref 100–199)
HDL: 70 mg/dL (ref >39)
LDL Chol Calc (NIH): 121 mg/dL — ABNORMAL HIGH (ref 0–99)
Triglycerides: 66 mg/dL (ref 0–149)
VLDL Cholesterol Cal: 12 mg/dL (ref 5–40)

## 2023-09-09 LAB — HEMOGLOBIN A1C
Est. average glucose Bld gHb Est-mCnc: 120 mg/dL
Hgb A1c MFr Bld: 5.8 % — ABNORMAL HIGH (ref 4.8–5.6)

## 2023-09-12 ENCOUNTER — Encounter: Payer: Self-pay | Admitting: Family Medicine

## 2023-10-09 ENCOUNTER — Other Ambulatory Visit: Payer: Self-pay | Admitting: Family Medicine

## 2023-10-11 NOTE — Telephone Encounter (Signed)
 Requested Prescriptions  Pending Prescriptions Disp Refills   losartan-hydrochlorothiazide (HYZAAR) 100-12.5 MG tablet [Pharmacy Med Name: LOSARTAN-HCTZ 100-12.5 MG TAB] 90 tablet 1    Sig: TAKE 0.5 TABLETS BY MOUTH 2 TIMES DAILY.     Cardiovascular: ARB + Diuretic Combos Passed - 10/11/2023  9:31 AM      Passed - K in normal range and within 180 days    Potassium  Date Value Ref Range Status  09/08/2023 4.2 3.5 - 5.2 mmol/L Final         Passed - Na in normal range and within 180 days    Sodium  Date Value Ref Range Status  09/08/2023 139 134 - 144 mmol/L Final         Passed - Cr in normal range and within 180 days    Creatinine, Ser  Date Value Ref Range Status  09/08/2023 0.80 0.57 - 1.00 mg/dL Final         Passed - eGFR is 10 or above and within 180 days    GFR calc Af Amer  Date Value Ref Range Status  05/29/2020 97 >59 mL/min/1.73 Final    Comment:    **In accordance with recommendations from the NKF-ASN Task force,**   [facility] is in the process of updating its eGFR calculation to the   2021 CKD-EPI creatinine equation that estimates kidney function   without a race variable.    GFR calc non Af Amer  Date Value Ref Range Status  05/29/2020 84 >59 mL/min/1.73 Final   eGFR  Date Value Ref Range Status  09/08/2023 79 >59 mL/min/1.73 Final         Passed - Patient is not pregnant      Passed - Last BP in normal range    BP Readings from Last 1 Encounters:  09/08/23 117/62         Passed - Valid encounter within last 6 months    Recent Outpatient Visits           4 months ago Encounter for annual physical exam   [facility] [place], Marzella Schlein, MD   6 months ago Benign essential HTN   [place] [facility] [place], Marzella Schlein, MD   7 months ago Benign essential HTN   [place] [facility] [place], Marzella Schlein, MD   10 months ago Benign essential HTN   [place] [facility]  [place], Marzella Schlein, MD   1 year ago Benign essential HTN   [place] [facility] [place], Marzella Schlein, MD       Future Appointments             In 8 months [place], Marzella Schlein, MD [facility]   In 8 months [place]al[place], Marzella Schlein, MD [facility]

## 2023-12-14 ENCOUNTER — Ambulatory Visit (INDEPENDENT_AMBULATORY_CARE_PROVIDER_SITE_OTHER): Admitting: Family Medicine

## 2023-12-14 ENCOUNTER — Encounter: Payer: Self-pay | Admitting: Family Medicine

## 2023-12-14 VITALS — BP 137/70 | HR 72 | Resp 16 | Ht 64.5 in | Wt 135.6 lb

## 2023-12-14 DIAGNOSIS — R591 Generalized enlarged lymph nodes: Secondary | ICD-10-CM

## 2023-12-22 ENCOUNTER — Other Ambulatory Visit: Payer: Self-pay | Admitting: Family Medicine

## 2023-12-22 DIAGNOSIS — Z1231 Encounter for screening mammogram for malignant neoplasm of breast: Secondary | ICD-10-CM

## 2024-01-13 NOTE — Progress Notes (Signed)
      Established patient visit   Patient: Mckenzie Willis   DOB: 1950-09-01   73 y.o. Female  MRN: [id number] Visit Date: 12/14/2023  Today's healthcare provider: Jeralene Mom, MD   Chief Complaint  Patient presents with   Neck Pain    L neck side x 1 week has gotten a little better.    Subjective    Discussed the use of AI scribe software for clinical note transcription with the patient, who gave verbal consent to proceed.  History of Present Illness   Mckenzie Willis is a 73 year old female who presents with persistent gland soreness following a recent cold.  She experienced a cold in April and another one approximately three weeks ago. After recovering from the recent cold, she noticed soreness in her gland area about a week ago. The soreness is localized and particularly tender to touch, with the right side being more affected than the left.  She has not taken any cold medications but has used a dual-action ibuprofen and Tylenol  mix for a couple of days, including one dose yesterday morning. The soreness has improved since last night.  No pain or difficulty swallowing, fever, or ear pain. She reports sinus issues predominantly on the left side and occasional itching in her ear, which she associates with sinus problems.  She is concerned about the persistent soreness as she is rarely sick and is planning to travel out of town on Friday.       Medications: Outpatient Medications Prior to Visit  Medication Sig   Cholecalciferol (VITAMIN D -3) 25 MCG (1000 UT) CAPS Take 1 capsule by mouth daily.   losartan -hydrochlorothiazide  (HYZAAR) 100-12.5 MG tablet TAKE 0.5 TABLETS BY MOUTH 2 TIMES DAILY.   rosuvastatin  (CRESTOR ) 10 MG tablet TAKE 1 TABLET BY MOUTH THREE TIMES A WEEK.   No facility-administered medications prior to visit.   Review of Systems     Objective    BP 137/70 (BP Location: Left Arm, Patient Position: Sitting, Cuff Size: Normal)   Pulse 72    Resp 16   Ht 5' 4.5 (1.638 m)   Wt 135 lb 9.6 oz (61.5 kg)   SpO2 99%   BMI 22.92 kg/m   Physical Exam  Physical Exam   NECK: Mild lymphadenopathy       Assessment & Plan       Lymphadenopathy Likely residual from recent cold causing lymphadenopathy. Symptoms improving, no antibiotics needed. - Monitor for worsening or new symptoms such as fever, chills, or sweats. - Use ibuprofen for pain management if soreness increases. - Contact clinic if symptoms worsen or new symptoms develop. - Reassured she should be fine during her trip but remain vigilant for any changes in symptoms.         Jeralene Mom, MD  [facility] [phone] (phone) [phone] (fax)  [facility]

## 2024-01-31 ENCOUNTER — Ambulatory Visit
Admission: RE | Admit: 2024-01-31 | Discharge: 2024-01-31 | Disposition: A | Discharge status: Home / Self Care | Source: Ambulatory Visit | Attending: Family Medicine | Admitting: Family Medicine

## 2024-01-31 DIAGNOSIS — Z1231 Encounter for screening mammogram for malignant neoplasm of breast: Secondary | ICD-10-CM | POA: Insufficient documentation

## 2024-02-06 ENCOUNTER — Other Ambulatory Visit: Payer: Self-pay | Admitting: Family Medicine

## 2024-02-06 DIAGNOSIS — R928 Other abnormal and inconclusive findings on diagnostic imaging of breast: Secondary | ICD-10-CM

## 2024-02-10 ENCOUNTER — Ambulatory Visit
Admission: RE | Admit: 2024-02-10 | Discharge: 2024-02-10 | Disposition: A | Discharge status: Home / Self Care | Source: Ambulatory Visit | Attending: Family Medicine | Admitting: Family Medicine

## 2024-02-10 DIAGNOSIS — R928 Other abnormal and inconclusive findings on diagnostic imaging of breast: Secondary | ICD-10-CM

## 2024-02-10 DIAGNOSIS — R92322 Mammographic fibroglandular density, left breast: Secondary | ICD-10-CM | POA: Diagnosis not present

## 2024-02-22 ENCOUNTER — Ambulatory Visit

## 2024-02-22 DIAGNOSIS — Z0000 Encounter for general adult medical examination without abnormal findings: Secondary | ICD-10-CM

## 2024-02-22 NOTE — Patient Instructions (Signed)
 [name] , Thank you for taking time out of your busy schedule to complete your Annual Wellness Visit with me. I enjoyed our conversation and look forward to speaking with you again next year. I, as well as your care team,  appreciate your ongoing commitment to your health goals. Please review the following plan we discussed and let me know if I can assist you in the future.  Next Medicare AWV with our clinical staff:   02/27/25 @ 11:30 AM BY PHONE Have you seen your provider in the last 6 months (3 months if uncontrolled diabetes)? Yes   Clinician Recommendations:  Aim for 30 minutes of exercise or brisk walking, 6-8 glasses of water, and 5 servings of fruits and vegetables each day. TAKE CARE!      This is a list of the screening recommended for you and due dates:  Health Maintenance  Topic Date Due   DTaP/Tdap/Td vaccine (1 - Tdap) Never done   Pneumococcal Vaccine for age over 68 (1 of 1 - PCV) Never done   Zoster (Shingles) Vaccine (1 of 2) Never done   COVID-19 Vaccine (1 - 2024-25 season) Never done   Flu Shot  02/24/2024   Colon Cancer Screening  01/10/2025   Mammogram  01/30/2025   Medicare Annual Wellness Visit  02/21/2025   DEXA scan (bone density measurement)  01/21/2027   Hepatitis C Screening  Completed   Hepatitis B Vaccine  Aged Out   HPV Vaccine  Aged Out   Meningitis B Vaccine  Aged Out    Advanced directives: (ACP Link)Information on Advanced Care Planning can be found at [place]  Print production planner Health Care Directives Advance Health Care Directives. [email or web address]  Advance Care Planning is important because it:  [x]  Makes sure you receive the medical care that is consistent with your values, goals, and preferences  [x]  It provides guidance to your family and loved ones and reduces their decisional burden about whether or not they are making the right decisions based on your wishes.  Follow the link provided in your after visit summary or read over the  paperwork we have mailed to you to help you started getting your Advance Directives in place. If you need assistance in completing these, please reach out to us  so that we can help you!

## 2024-02-22 NOTE — Progress Notes (Signed)
 Subjective:   Mckenzie Willis is a 73 y.o. who presents for a Medicare Wellness preventive visit.  As a reminder, Annual Wellness Visits don't include a physical exam, and some assessments may be limited, especially if this visit is performed virtually. We may recommend an in-person follow-up visit with your provider if needed.  Visit Complete: Virtual I connected with  Mckenzie Willis on 02/22/24 by a audio enabled telemedicine application and verified that I am speaking with the correct person using two identifiers.  Patient Location: Home  Provider Location: Home Office  I discussed the limitations of evaluation and management by telemedicine. The patient expressed understanding and agreed to proceed.  Vital Signs: Because this visit was a virtual/telehealth visit, some criteria may be missing or patient reported. Any vitals not documented were not able to be obtained and vitals that have been documented are patient reported.  VideoDeclined- This patient declined Librarian, academic. Therefore the visit was completed with audio only.  Persons Participating in Visit: Patient.  AWV Questionnaire: No: Patient Medicare AWV questionnaire was not completed prior to this visit.  Cardiac Risk Factors include: advanced age (>42men, >16 women);dyslipidemia;hypertension;obesity (BMI >30kg/m2)     Objective:    There were no vitals filed for this visit. There is no height or weight on file to calculate BMI.     02/22/2024   12:41 PM 01/25/2023   11:34 AM 06/25/2021   11:03 AM 01/11/2020   10:44 AM  Advanced Directives  Does Patient Have a Medical Advance Directive? No No No No  Would patient like information on creating a medical advance directive? No - Patient declined  No - Patient declined     Current Medications (verified) Outpatient Encounter Medications as of 02/22/2024  Medication Sig   Cholecalciferol (VITAMIN D -3) 25 MCG (1000 UT) CAPS  Take 1 capsule by mouth daily.   losartan -hydrochlorothiazide  (HYZAAR) 100-12.5 MG tablet TAKE 0.5 TABLETS BY MOUTH 2 TIMES DAILY.   rosuvastatin  (CRESTOR ) 10 MG tablet TAKE 1 TABLET BY MOUTH THREE TIMES A WEEK.   No facility-administered encounter medications on file as of 02/22/2024.    Allergies (verified) Patient has no known allergies.   History: Past Medical History:  Diagnosis Date   Arthritis    Back pain, chronic    History of chicken pox    History of measles    History of mumps    Hyperlipidemia    Hypertension    Plantar fasciitis    Past Surgical History:  Procedure Laterality Date   BROW LIFT Bilateral 06/25/2021   Procedure: BLEPHAROPLASTY UPPER EYELID; W/ EXCESS SKIN BILATERAL;  Surgeon: Ashley Greig HERO, MD;  [facility];  Service: Ophthalmology;  Laterality: Bilateral;  bilateral upper eye lids   COLONOSCOPY WITH PROPOFOL      COLONOSCOPY WITH PROPOFOL  N/A 01/11/2020   Procedure: COLONOSCOPY WITH PROPOFOL ;  Surgeon: Dessa Reyes ORN, MD;  Location: [facility];  Service: Endoscopy;  Laterality: N/A;   TOE SURGERY  2018   Dr Verta   TUBAL LIGATION     Family History  Problem Relation Age of Onset   Hypertension Mother    Heart disease Mother        s/p CABG in 1s   Colon polyps Mother    Hypertension Brother    Cancer Brother        Melanoma    Hypertension Maternal Grandmother    Breast cancer Paternal Aunt 46   Emphysema Father  Diabetes Paternal Grandfather    Breast cancer Maternal Aunt        great aunt   Colon cancer Neg Hx    Social History   Socioeconomic History   Marital status: Married    Spouse name: Not on file   Number of children: 3   Years of education: Not on file   Highest education level: Not on file  Occupational History   Occupation: retired [employer]  Tobacco Use   Smoking status: Former    Current packs/day: 0.00    Average packs/day: 0.8 packs/day for 10.0 years (7.5 ttl pk-yrs)    Types:  Cigarettes    Start date: 32    Quit date: 1983    Years since quitting: 42.6   Smokeless tobacco: Never  Vaping Use   Vaping status: Never Used  Substance and Sexual Activity   Alcohol use: Yes    Alcohol/week: 1.0 - 4.0 standard drink of alcohol    Types: 1 - 4 Glasses of wine per week   Drug use: No   Sexual activity: Not Currently  Other Topics Concern   Not on file  Social History Narrative   Lives in [place] with husband and mother. Has 2 dogs in home.      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      Diet - regular diet, limited sugar, increased protein      Exercise - none at present   Social Drivers of [employer]: Low Risk  (02/22/2024)   Overall Financial Resource Strain (CARDIA)    Difficulty of Paying Living Expenses: Not hard at all  Food Insecurity: No Food Insecurity (02/22/2024)   Hunger Vital Sign    Worried About Running Out of Food in the Last Year: Never true    Ran Out of Food in the Last Year: Never true  Transportation Needs: No Transportation Needs (02/22/2024)   PRAPARE - Administrator, Civil Service (Medical): No    Lack of Transportation (Non-Medical): No  Physical Activity: Insufficiently Active (02/22/2024)   Exercise Vital Sign    Days of Exercise per Week: 5 days    Minutes of Exercise per Session: 20 min  Stress: No Stress Concern Present (02/22/2024)   [employer] of Occupational Health - Occupational Stress Questionnaire    Feeling of Stress: Not at all  Social Connections: Socially Integrated (02/22/2024)   Social Connection and Isolation Panel    Frequency of Communication with Friends and Family: More than three times a week    Frequency of Social Gatherings with Friends and Family: Three times a week    Attends Religious Services: More than 4 times per year    Active Member of Clubs or Organizations: Yes    Attends Engineer, structural: More than 4 times per year    Marital Status: Married     Tobacco Counseling Counseling given: Not Answered    Clinical Intake:  Pre-visit preparation completed: Yes  Pain : No/denies pain     BMI - recorded: 22.8 Nutritional Status: BMI of 19-24  Normal Nutritional Risks: None Diabetes: No  Lab Results  Component Value Date   HGBA1C 5.8 (H) 09/08/2023   HGBA1C 5.8 (H) 12/02/2022   HGBA1C 5.6 05/28/2022     How often do you need to have someone help you when you read instructions, pamphlets, or other written materials from your doctor or pharmacy?: 1 - Never  Interpreter Needed?: No  Information  entered by :: JHONNIE DAS, LPN   Activities of Daily Living    02/22/2024   12:44 PM  In your present state of health, do you have any difficulty performing the following activities:  Hearing? 0  Vision? 0  Difficulty concentrating or making decisions? 0  Walking or climbing stairs? 0  Dressing or bathing? 0  Doing errands, shopping? 0  Preparing Food and eating ? N  Using the Toilet? N  In the past six months, have you accidently leaked urine? N  Do you have problems with loss of bowel control? N  Managing your Medications? N  Managing your Finances? N  Housekeeping or managing your Housekeeping? N    Patient Care Team: Myrla Jon HERO, MD as PCP - General (Family Medicine) Estelle Fallow (Optometry)  I have updated your Care Teams any recent Medical Services you may have received from other providers in the past year.     Assessment:   This is a routine wellness examination for Mckenzie Willis.  Hearing/Vision screen Hearing Screening - Comments:: NO AIDS Vision Screening - Comments:: [facility]- DR.RICHARDSON   Goals Addressed             This Visit's Progress    DIET - EAT MORE FRUITS AND VEGETABLES         Depression Screen     02/22/2024   12:39 PM 12/14/2023   11:04 AM 12/14/2023   11:03 AM 09/08/2023    9:46 AM 04/11/2023   10:24 AM 03/14/2023    1:17 PM 01/25/2023   11:20 AM  PHQ 2/9  Scores  PHQ - 2 Score 0 0 0 0 0 0 0  PHQ- 9 Score 0 0   0 0     Fall Risk     02/22/2024   12:43 PM 12/14/2023   11:04 AM 09/08/2023    9:46 AM 03/14/2023    1:16 PM 01/25/2023   11:13 AM  Fall Risk   Falls in the past year? 0 1 0 1 1  Number falls in past yr: 0 0 0 0 0  Injury with Fall? 0 0 0 0 0  Risk for fall due to : No Fall Risks  No Fall Risks History of fall(s)   Follow up Falls evaluation completed;Falls prevention discussed  Falls evaluation completed Falls evaluation completed Education provided;Falls prevention discussed    MEDICARE RISK AT HOME:  Medicare Risk at Home Any stairs in or around the home?: Yes If so, are there any without handrails?: No Home free of loose throw rugs in walkways, pet beds, electrical cords, etc?: Yes Adequate lighting in your home to reduce risk of falls?: Yes Life alert?: No Use of a cane, walker or w/c?: No Grab bars in the bathroom?: No Shower chair or bench in shower?: Yes Elevated toilet seat or a handicapped toilet?: Yes  TIMED UP AND GO:  Was the test performed?  No  Cognitive Function: 6CIT completed        02/22/2024   12:46 PM 01/25/2023   11:43 AM 11/24/2021   10:27 AM 11/08/2019    9:03 AM  6CIT Screen  What Year? 0 points 0 points 0 points 0 points  What month? 0 points 0 points 0 points 0 points  What time? 0 points 0 points 0 points 0 points  Count back from 20 0 points 0 points 0 points 0 points  Months in reverse 0 points 0 points 0 points 2 points  Repeat phrase 2 points  0 points 0 points 0 points  Total Score 2 points 0 points 0 points 2 points    Immunizations Immunization History  Administered Date(s) Administered   Fluad Quad(high Dose 65+) 05/28/2022, 03/14/2023   Influenza-Unspecified 06/26/2015    Screening Tests Health Maintenance  Topic Date Due   DTaP/Tdap/Td (1 - Tdap) Never done   Pneumococcal Vaccine: 50+ Years (1 of 1 - PCV) Never done   Zoster Vaccines- Shingrix (1 of 2) Never done    COVID-19 Vaccine (1 - 2024-25 season) Never done   INFLUENZA VACCINE  02/24/2024   Colonoscopy  01/10/2025   MAMMOGRAM  01/30/2025   Medicare Annual Wellness (AWV)  02/21/2025   DEXA SCAN  01/21/2027   Hepatitis C Screening  Completed   Hepatitis B Vaccines  Aged Out   HPV VACCINES  Aged Out   Meningococcal B Vaccine  Aged Out    Health Maintenance  Health Maintenance Due  Topic Date Due   DTaP/Tdap/Td (1 - Tdap) Never done   Pneumococcal Vaccine: 50+ Years (1 of 1 - PCV) Never done   Zoster Vaccines- Shingrix (1 of 2) Never done   COVID-19 Vaccine (1 - 2024-25 season) Never done   Health Maintenance Items Addressed: UP TO DATE ON MAMMOGRAM, COLONOSCOPY & BDS; DECLINES SHOTS  Additional Screening:  Vision Screening: Recommended annual ophthalmology exams for early detection of glaucoma and other disorders of the eye. Would you like a referral to an eye doctor? No    Dental Screening: Recommended annual dental exams for proper oral hygiene  Community Resource Referral / Chronic Care Management: CRR required this visit?  No   CCM required this visit?  No   Plan:    I have personally reviewed and noted the following in the patient's chart:   Medical and social history Use of alcohol, tobacco or illicit drugs  Current medications and supplements including opioid prescriptions. Patient is not currently taking opioid prescriptions. Functional ability and status Nutritional status Physical activity Advanced directives List of other physicians Hospitalizations, surgeries, and ER visits in previous 12 months Vitals Screenings to include cognitive, depression, and falls Referrals and appointments  In addition, I have reviewed and discussed with patient certain preventive protocols, quality metrics, and best practice recommendations. A written personalized care plan for preventive services as well as general preventive health recommendations were provided to  patient.   Jhonnie GORMAN Das, LPN   2/69/7974   After Visit Summary: (MyChart) Due to this being a telephonic visit, the after visit summary with patients personalized plan was offered to patient via MyChart   Notes: Nothing significant to report at this time.

## 2024-03-16 NOTE — Progress Notes (Signed)
 Pharmacy Quality Measure Review  This patient is appearing on a report for being at risk of failing the adherence measure for cholesterol (statin) medications this calendar year.   Medication: rosuvastatin  10 mg Last fill date: 12/08/23 for 90 day supply  Contacted pharmacy to facilitate refills.  Calem Cocozza E. Marsh, PharmD Clinical Pharmacist [facility] [phone]

## 2024-04-02 DIAGNOSIS — L578 Other skin changes due to chronic exposure to nonionizing radiation: Secondary | ICD-10-CM | POA: Diagnosis not present

## 2024-04-02 DIAGNOSIS — L853 Xerosis cutis: Secondary | ICD-10-CM | POA: Diagnosis not present

## 2024-04-02 DIAGNOSIS — L821 Other seborrheic keratosis: Secondary | ICD-10-CM | POA: Diagnosis not present

## 2024-04-02 DIAGNOSIS — L2089 Other atopic dermatitis: Secondary | ICD-10-CM | POA: Diagnosis not present

## 2024-04-02 DIAGNOSIS — Z85828 Personal history of other malignant neoplasm of skin: Secondary | ICD-10-CM | POA: Diagnosis not present

## 2024-04-02 DIAGNOSIS — L570 Actinic keratosis: Secondary | ICD-10-CM | POA: Diagnosis not present

## 2024-04-02 DIAGNOSIS — L508 Other urticaria: Secondary | ICD-10-CM | POA: Diagnosis not present

## 2024-04-02 DIAGNOSIS — Z872 Personal history of diseases of the skin and subcutaneous tissue: Secondary | ICD-10-CM | POA: Diagnosis not present

## 2024-04-09 ENCOUNTER — Other Ambulatory Visit: Payer: Self-pay | Admitting: Family Medicine

## 2024-04-16 NOTE — Progress Notes (Signed)
 Pharmacy Quality Measure Review  This patient is appearing on a report for being at risk of failing the adherence measure for cholesterol (statin) medications this calendar year.   Medication: rosuvastatin  Last fill date: 03/16/24 for 84 day supply  Insurance report was not up to date. No action needed at this time.   Bland Rudzinski E. Marsh, PharmD Clinical Pharmacist [facility] [phone]

## 2024-04-26 DIAGNOSIS — L570 Actinic keratosis: Secondary | ICD-10-CM | POA: Diagnosis not present

## 2024-05-31 DIAGNOSIS — L720 Epidermal cyst: Secondary | ICD-10-CM | POA: Diagnosis not present

## 2024-05-31 DIAGNOSIS — J340 Abscess, furuncle and carbuncle of nose: Secondary | ICD-10-CM | POA: Diagnosis not present

## 2024-05-31 DIAGNOSIS — D492 Neoplasm of unspecified behavior of bone, soft tissue, and skin: Secondary | ICD-10-CM | POA: Diagnosis not present

## 2024-06-07 ENCOUNTER — Ambulatory Visit (INDEPENDENT_AMBULATORY_CARE_PROVIDER_SITE_OTHER): Admitting: Family Medicine

## 2024-06-07 ENCOUNTER — Encounter: Payer: Self-pay | Admitting: Family Medicine

## 2024-06-07 VITALS — BP 130/64 | HR 65 | Ht 64.5 in | Wt 135.0 lb

## 2024-06-07 DIAGNOSIS — Z0000 Encounter for general adult medical examination without abnormal findings: Secondary | ICD-10-CM | POA: Diagnosis not present

## 2024-06-07 DIAGNOSIS — E041 Nontoxic single thyroid nodule: Secondary | ICD-10-CM | POA: Diagnosis not present

## 2024-06-07 DIAGNOSIS — R7303 Prediabetes: Secondary | ICD-10-CM

## 2024-06-07 DIAGNOSIS — I10 Essential (primary) hypertension: Secondary | ICD-10-CM | POA: Diagnosis not present

## 2024-06-07 DIAGNOSIS — E782 Mixed hyperlipidemia: Secondary | ICD-10-CM

## 2024-06-07 DIAGNOSIS — Z83719 Family history of colon polyps, unspecified: Secondary | ICD-10-CM

## 2024-06-07 MED ORDER — LOSARTAN POTASSIUM-HCTZ 100-12.5 MG PO TABS: 1.0000 | ORAL_TABLET | Freq: Every day | ORAL | 1 refills | Status: AC

## 2024-06-07 MED ORDER — ROSUVASTATIN CALCIUM 10 MG PO TABS
ORAL_TABLET | ORAL | 3 refills | Status: AC
Start: 2024-06-07 — End: ?

## 2024-06-07 NOTE — Assessment & Plan Note (Signed)
 Blood pressure management discussed. Current regimen includes losartan  and HCTZ. Blood pressure elevated in mornings, possibly due to overnight rise. Discussed option of taking medication at bedtime to manage overnight rise, considering potential nocturia due to HCTZ component. She prefers to try nighttime dosing for a month before reverting to daytime dosing if needed. - Updated prescription to whole pill once daily. - Try taking blood pressure medication at bedtime for one month. - Scheduled follow-up in six months for blood pressure management.

## 2024-06-07 NOTE — Patient Instructions (Signed)
 I strongly recommend two doses of Shingrix (the shingles vaccine) separated by 2 to 6 months for adults age 71 years and older. I recommend checking with your insurance plan regarding coverage for this vaccine.

## 2024-06-07 NOTE — Progress Notes (Signed)
 Complete physical exam   Patient: Mckenzie Willis   DOB: 08-01-1950   73 y.o. Female  MRN: [id number] Visit Date: 06/07/2024  Today's healthcare provider: Jon Eva, MD   Chief Complaint  Patient presents with   Annual Exam    Last completed 06/07/23 Diet -  Low carb Exercise - walking dog  Feeling - well Sleeping - well Concerns -  none   Subjective    Mckenzie Willis is a 73 y.o. female who presents today for a complete physical exam.    Discussed the use of AI scribe software for clinical note transcription with the patient, who gave verbal consent to proceed.  History of Present Illness   Mckenzie Willis is a 73 year old female who presents for an annual physical exam.  She takes losartan  with hydrochlorothiazide  100-12.5 mg daily and Crestor  10 mg daily. She recently adjusted her blood pressure medication intake to the full dose in the morning due to elevated morning readings. She experienced a morning blood pressure of 115 mmHg, which increased to 153 mmHg by 3 PM when she skipped her dose. Her blood pressure tends to rise overnight, with a morning reading of 153/unknown decreasing to 142/unknown after sitting.  For hyperlipidemia, she is inconsistent with Crestor , taking it at least twice a week instead of the prescribed three times.  Her mother had polyps removed, prompting her and her brother to undergo early colonoscopies.        Last depression screening scores    02/22/2024   12:39 PM 12/14/2023   11:04 AM 12/14/2023   11:03 AM  PHQ 2/9 Scores  PHQ - 2 Score 0 0 0  PHQ- 9 Score 0  0       Data saved with a previous flowsheet row definition   Last fall risk screening    02/22/2024   12:43 PM  Fall Risk   Falls in the past year? 0  Number falls in past yr: 0  Injury with Fall? 0  Risk for fall due to : No Fall Risks  Follow up Falls evaluation completed;Falls prevention discussed        Medications: Outpatient  Medications Prior to Visit  Medication Sig   Cholecalciferol (VITAMIN D -3) 25 MCG (1000 UT) CAPS Take 1 capsule by mouth daily.   [DISCONTINUED] losartan -hydrochlorothiazide  (HYZAAR) 100-12.5 MG tablet TAKE 0.5 TABLETS BY MOUTH 2 TIMES DAILY.   [DISCONTINUED] rosuvastatin  (CRESTOR ) 10 MG tablet TAKE 1 TABLET BY MOUTH THREE TIMES A WEEK.   No facility-administered medications prior to visit.    Review of Systems    Objective    BP 130/64 (BP Location: Left Arm, Patient Position: Sitting, Cuff Size: Normal)   Pulse 65   Ht 5' 4.5 (1.638 m)   Wt 135 lb (61.2 kg)   SpO2 100%   BMI 22.81 kg/m    Physical Exam Vitals reviewed.  Constitutional:      General: She is not in acute distress.    Appearance: Normal appearance. She is well-developed. She is not diaphoretic.  HENT:     Head: Normocephalic and atraumatic.     Right Ear: Tympanic membrane, ear canal and external ear normal.     Left Ear: Tympanic membrane, ear canal and external ear normal.     Nose: Nose normal.     Mouth/Throat:     Mouth: Mucous membranes are moist.     Pharynx: Oropharynx is clear. No oropharyngeal exudate.  Eyes:  General: No scleral icterus.    Conjunctiva/sclera: Conjunctivae normal.     Pupils: Pupils are equal, round, and reactive to light.  Neck:     Thyroid : No thyromegaly.  Cardiovascular:     Rate and Rhythm: Normal rate and regular rhythm.     Heart sounds: Normal heart sounds. No murmur heard. Pulmonary:     Effort: Pulmonary effort is normal. No respiratory distress.     Breath sounds: Normal breath sounds. No wheezing or rales.  Abdominal:     General: There is no distension.     Palpations: Abdomen is soft.     Tenderness: There is no abdominal tenderness.  Musculoskeletal:        General: No deformity.     Cervical back: Neck supple.     Right lower leg: No edema.     Left lower leg: No edema.  Lymphadenopathy:     Cervical: No cervical adenopathy.  Skin:    General:  Skin is warm and dry.     Findings: No rash.  Neurological:     Mental Status: She is alert and oriented to person, place, and time. Mental status is at baseline.     Gait: Gait normal.  Psychiatric:        Mood and Affect: Mood normal.        Behavior: Behavior normal.        Thought Content: Thought content normal.      No results found for any visits on 06/07/24.  Assessment & Plan    Routine Health Maintenance and Physical Exam  Exercise Activities and Dietary recommendations  Goals      DIET - EAT MORE FRUITS AND VEGETABLES        Immunization History  Administered Date(s) Administered   Fluad Quad(high Dose 65+) 05/28/2022, 03/14/2023   Influenza-Unspecified 06/26/2015    Health Maintenance  Topic Date Due   DTaP/Tdap/Td (1 - Tdap) Never done   Pneumococcal Vaccine: 50+ Years (1 of 1 - PCV) Never done   Zoster Vaccines- Shingrix (1 of 2) Never done   COVID-19 Vaccine (1 - 2025-26 season) Never done   Influenza Vaccine  10/23/2024 (Originally 02/24/2024)   Colonoscopy  01/10/2025   Mammogram  01/30/2025   Medicare Annual Wellness (AWV)  02/21/2025   DEXA SCAN  01/21/2027   Hepatitis C Screening  Completed   Meningococcal B Vaccine  Aged Out    Discussed health benefits of physical activity, and encouraged her to engage in regular exercise appropriate for her age and condition.  Problem List Items Addressed This Visit       Cardiovascular and Mediastinum   Benign essential HTN   Blood pressure management discussed. Current regimen includes losartan  and HCTZ. Blood pressure elevated in mornings, possibly due to overnight rise. Discussed option of taking medication at bedtime to manage overnight rise, considering potential nocturia due to HCTZ component. She prefers to try nighttime dosing for a month before reverting to daytime dosing if needed. - Updated prescription to whole pill once daily. - Try taking blood pressure medication at bedtime for one month. -  Scheduled follow-up in six months for blood pressure management.      Relevant Medications   losartan -hydrochlorothiazide  (HYZAAR) 100-12.5 MG tablet   rosuvastatin  (CRESTOR ) 10 MG tablet   Other Relevant Orders   Comprehensive metabolic panel with GFR     Endocrine   Thyroid  nodule   Thyroid  function to be monitored with routine lab work.  Relevant Orders   TSH     Other   HLD (hyperlipidemia)   Currently on Crestor  10 mg 3 times weekly. Reports difficulty adhering to three times a week dosing, currently taking twice a week. - Continue Crestor  10 mg three times a week as tolerated.      Relevant Medications   losartan -hydrochlorothiazide  (HYZAAR) 100-12.5 MG tablet   rosuvastatin  (CRESTOR ) 10 MG tablet   Other Relevant Orders   Comprehensive metabolic panel with GFR   Lipid panel   Family history of colonic polyps   Prediabetes   Monitoring A1c levels as part of routine lab work. - Ordered A1c as part of lab work.      Relevant Orders   Hemoglobin A1c   Other Visit Diagnoses       Encounter for annual physical exam    -  Primary   Relevant Orders   TSH   Hemoglobin A1c   Comprehensive metabolic panel with GFR   Lipid panel           Adult Wellness Visit Annual physical examination conducted. Discussed wellness and screening measures, including vaccinations and screenings. Mammogram from July was satisfactory after additional views. Colonoscopy due next summer, with referral to be initiated in February or March to avoid scheduling issues. Labs for cholesterol, kidney and liver function, thyroid , and A1c were drawn today. - Continue routine wellness and screening measures. - Consider shingles vaccine at pharmacy. - Will initiate referral for colonoscopy in February or March. - Ordered labs for cholesterol, kidney and liver function, thyroid , and A1c.       Return in about 6 months (around 12/05/2024) for chronic disease f/u.     Jon Eva,  MD  [facility] [phone] (phone) [phone] (fax)  [facility]

## 2024-06-07 NOTE — Assessment & Plan Note (Signed)
 Currently on Crestor  10 mg 3 times weekly. Reports difficulty adhering to three times a week dosing, currently taking twice a week. - Continue Crestor  10 mg three times a week as tolerated.

## 2024-06-07 NOTE — Assessment & Plan Note (Signed)
 Monitoring A1c levels as part of routine lab work. - Ordered A1c as part of lab work.

## 2024-06-07 NOTE — Assessment & Plan Note (Signed)
 Thyroid  function to be monitored with routine lab work.

## 2024-06-08 ENCOUNTER — Encounter: Payer: PPO | Admitting: Family Medicine

## 2024-06-08 LAB — COMPREHENSIVE METABOLIC PANEL WITH GFR
ALT: 8 IU/L (ref 0–32)
AST: 19 IU/L (ref 0–40)
Albumin: 4.5 g/dL (ref 3.8–4.8)
Alkaline Phosphatase: 79 IU/L (ref 49–135)
BUN/Creatinine Ratio: 24 (ref 12–28)
BUN: 18 mg/dL (ref 8–27)
Bilirubin Total: 0.6 mg/dL (ref 0.0–1.2)
CO2: 26 mmol/L (ref 20–29)
Calcium: 9.9 mg/dL (ref 8.7–10.3)
Chloride: 101 mmol/L (ref 96–106)
Creatinine, Ser: 0.75 mg/dL (ref 0.57–1.00)
Globulin, Total: 2.6 g/dL (ref 1.5–4.5)
Glucose: 95 mg/dL (ref 70–99)
Potassium: 4.1 mmol/L (ref 3.5–5.2)
Sodium: 142 mmol/L (ref 134–144)
Total Protein: 7.1 g/dL (ref 6.0–8.5)
eGFR: 85 mL/min/1.73 (ref >59)

## 2024-06-08 LAB — HEMOGLOBIN A1C
Est. average glucose Bld gHb Est-mCnc: 114 mg/dL
Hgb A1c MFr Bld: 5.6 % (ref 4.8–5.6)

## 2024-06-08 LAB — LIPID PANEL
Chol/HDL Ratio: 2.5 ratio (ref 0.0–4.4)
Cholesterol, Total: 212 mg/dL — ABNORMAL HIGH (ref 100–199)
HDL: 84 mg/dL (ref >39)
LDL Chol Calc (NIH): 117 mg/dL — ABNORMAL HIGH (ref 0–99)
Triglycerides: 61 mg/dL (ref 0–149)
VLDL Cholesterol Cal: 11 mg/dL (ref 5–40)

## 2024-06-08 LAB — TSH: TSH: 2.35 u[IU]/mL (ref 0.450–4.500)

## 2024-06-11 ENCOUNTER — Ambulatory Visit: Payer: Self-pay | Admitting: Family Medicine

## 2024-06-11 ENCOUNTER — Telehealth: Payer: Self-pay

## 2024-06-11 NOTE — Progress Notes (Signed)
 Pharmacy Quality Measure Review  This patient is appearing on a report for being at risk of failing the adherence measure for cholesterol (statin) medications this calendar year.   Medication: rosuvastatin Last fill date: 06/07/24 for 90 day supply  Insurance report was not up to date. No action needed at this time.   Sevilla Murtagh E. Marsh, PharmD, CPP Clinical Pharmacist [facility] [phone]

## 2024-06-22 ENCOUNTER — Telehealth: Payer: Self-pay

## 2024-06-25 NOTE — Telephone Encounter (Signed)
 RAF gap of 0 - does not need anything

## 2024-12-06 ENCOUNTER — Ambulatory Visit: Admitting: Family Medicine

## 2025-02-27 ENCOUNTER — Ambulatory Visit
